# Patient Record
Sex: Female | Born: 2000 | Race: Black or African American | Hispanic: No | Marital: Single | Smoking: Former smoker
Health system: Southern US, Community
[De-identification: ages and names within clinical notes are randomized; demographics above are authoritative.]

## PROBLEM LIST (undated history)

## (undated) DIAGNOSIS — Z789 Other specified health status: Secondary | ICD-10-CM

## (undated) DIAGNOSIS — U071 COVID-19: Secondary | ICD-10-CM

## (undated) HISTORY — PX: NO PAST SURGERIES: SHX2092

---
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
## 2021-01-01 ENCOUNTER — Telehealth: Payer: Self-pay | Admitting: Family Medicine

## 2021-01-01 NOTE — Telephone Encounter (Signed)
Normal lab report discussed.  Continue Pepcid as instructed. Return and ED precautions discussed.  She agreed with the plan.

## 2021-07-25 ENCOUNTER — Encounter: Payer: Self-pay | Admitting: Family Medicine

## 2021-07-25 ENCOUNTER — Encounter (HOSPITAL_COMMUNITY): Payer: Self-pay | Admitting: Obstetrics & Gynecology

## 2021-07-25 ENCOUNTER — Inpatient Hospital Stay (HOSPITAL_COMMUNITY)
Admission: AD | Admit: 2021-07-25 | Discharge: 2021-07-25 | Disposition: A | Discharge status: Home / Self Care | Payer: Medicaid Other | Attending: Obstetrics & Gynecology | Admitting: Obstetrics & Gynecology

## 2021-07-25 ENCOUNTER — Inpatient Hospital Stay (HOSPITAL_COMMUNITY): Payer: Medicaid Other

## 2021-07-25 ENCOUNTER — Ambulatory Visit (INDEPENDENT_AMBULATORY_CARE_PROVIDER_SITE_OTHER): Payer: Medicaid Other | Admitting: Family Medicine

## 2021-07-25 VITALS — BP 122/62 | HR 84 | Ht 67.0 in | Wt 169.2 lb

## 2021-07-25 DIAGNOSIS — O200 Threatened abortion: Secondary | ICD-10-CM

## 2021-07-25 DIAGNOSIS — O26891 Other specified pregnancy related conditions, first trimester: Secondary | ICD-10-CM | POA: Diagnosis not present

## 2021-07-25 DIAGNOSIS — O3680X0 Pregnancy with inconclusive fetal viability, not applicable or unspecified: Secondary | ICD-10-CM | POA: Diagnosis not present

## 2021-07-25 DIAGNOSIS — O209 Hemorrhage in early pregnancy, unspecified: Secondary | ICD-10-CM | POA: Insufficient documentation

## 2021-07-25 DIAGNOSIS — R1030 Lower abdominal pain, unspecified: Secondary | ICD-10-CM | POA: Diagnosis not present

## 2021-07-25 DIAGNOSIS — Z8616 Personal history of COVID-19: Secondary | ICD-10-CM | POA: Diagnosis not present

## 2021-07-25 DIAGNOSIS — Z3200 Encounter for pregnancy test, result unknown: Secondary | ICD-10-CM | POA: Diagnosis not present

## 2021-07-25 DIAGNOSIS — Z3A01 Less than 8 weeks gestation of pregnancy: Secondary | ICD-10-CM | POA: Diagnosis not present

## 2021-07-25 DIAGNOSIS — Z87891 Personal history of nicotine dependence: Secondary | ICD-10-CM | POA: Diagnosis not present

## 2021-07-25 DIAGNOSIS — N939 Abnormal uterine and vaginal bleeding, unspecified: Secondary | ICD-10-CM | POA: Diagnosis not present

## 2021-07-25 HISTORY — DX: Other specified health status: Z78.9

## 2021-07-25 LAB — CBC
HCT: 38.2 % (ref 36.0–46.0)
Hemoglobin: 13.2 g/dL (ref 12.0–15.0)
MCH: 30.1 pg (ref 26.0–34.0)
MCHC: 34.6 g/dL (ref 30.0–36.0)
MCV: 87.2 fL (ref 80.0–100.0)
Platelets: 246 10*3/uL (ref 150–400)
RBC: 4.38 MIL/uL (ref 3.87–5.11)
RDW: 13.2 % (ref 11.5–15.5)
WBC: 6.2 10*3/uL (ref 4.0–10.5)
nRBC: 0 % (ref 0.0–0.2)

## 2021-07-25 LAB — URINALYSIS, ROUTINE W REFLEX MICROSCOPIC
Bilirubin Urine: NEGATIVE
Glucose, UA: NEGATIVE mg/dL
Ketones, ur: NEGATIVE mg/dL
Leukocytes,Ua: NEGATIVE
Nitrite: NEGATIVE
Protein, ur: NEGATIVE mg/dL
Specific Gravity, Urine: 1.023 (ref 1.005–1.030)
pH: 5 (ref 5.0–8.0)

## 2021-07-25 LAB — HCG, QUANTITATIVE, PREGNANCY: hCG, Beta Chain, Quant, S: 536 m[IU]/mL — ABNORMAL HIGH (ref <5)

## 2021-07-25 LAB — WET PREP, GENITAL
Sperm: NONE SEEN
Trich, Wet Prep: NONE SEEN
WBC, Wet Prep HPF POC: <10 (ref <10)
Yeast Wet Prep HPF POC: NONE SEEN

## 2021-07-25 LAB — ABO/RH: ABO/RH(D): O POS

## 2021-07-25 LAB — POCT URINE PREGNANCY: Preg Test, Ur: POSITIVE — AB

## 2021-07-25 NOTE — Patient Instructions (Signed)
Go directly to the MAU (maternity assessment unit)  ? ?Maternity Assessment Unit (MAU)  ?Address: [address] [place], [place], [place] [address] ?Hours:  ?Open 24 hours ?Phone: [phone]  ? ? ? ? ?

## 2021-07-25 NOTE — MAU Note (Signed)
.  Valerie [name] is a 21 y.o. at Unknown here in MAU reporting: pt went to family practice today had positive pregnancy test 2 days ago. Stared having vaginal bleeding two night ago it was heavy then and a blood clot came out.  ?Still having some bleeding today with some cramping ?Onset of complaint: 2 days ?Pain score: 7/10 ?Vitals:  ? 07/25/21 1719  ?BP: 128/66  ?Pulse: 84  ?Resp: 18  ?Temp: 99.2 ?F (37.3 ?C)  ?   ?FHT:n/a ?Lab orders placed from triage:  u/a ? ?

## 2021-07-25 NOTE — Discharge Instructions (Signed)

## 2021-07-25 NOTE — MAU Provider Note (Addendum)
?History  ?  ? ?CSN: [id number] ? ?Arrival date and time: 07/25/21 1618 ? ? Event Date/Time  ? First Provider Initiated Contact with Patient 07/25/21 1744   ?  ? ?Chief Complaint  ?Patient presents with  ? Vaginal Bleeding  ? ?Valerie [name] is a 21 year old G1P0 female who presents with chief complaint of vaginal bleeding and cramping. She was seen at [facility] and was instructed to come to MAU due to her symptoms. She states her bleeding began 2 nights ago and states the bleeding is light and uses 1 pad per day. She states she passed a clot 2 nights ago but hasn't passed any clots since. She endorses lower abdominal cramping in her lower abdomen and is currently having light spotting. She denies any alleviating factors and states her pain is aggravated by touch.  ? ?Vaginal Bleeding ?The patient's primary symptoms include vaginal bleeding. The patient's pertinent negatives include no vaginal discharge. This is a new problem. The current episode started yesterday. The problem occurs constantly. The problem has been unchanged. The pain is moderate. The problem affects both sides. She is pregnant. Associated symptoms include abdominal pain and chills. Pertinent negatives include no constipation, diarrhea, fever, frequency, headaches, nausea or vomiting. The vaginal bleeding is spotting. She has been passing clots. She has not been passing tissue. The symptoms are aggravated by tactile pressure. She has tried nothing for the symptoms. She is sexually active.  ? ?OB History   ? ? Gravida  ?1  ? Para  ?   ? Term  ?   ? Preterm  ?   ? AB  ?   ? Living  ?   ?  ? ? SAB  ?   ? IAB  ?   ? Ectopic  ?   ? Multiple  ?   ? Live Births  ?   ?   ?  ?  ? ? ?Past Medical History:  ?Diagnosis Date  ? COVID-19   ? Medical history non-contributory   ? ? ?Past Surgical History:  ?Procedure Laterality Date  ? NO PAST SURGERIES    ? ? ?Family History  ?Problem Relation Age of Onset  ? Diabetes Mother   ? Healthy Father    ? ? ?Social History  ? ?Tobacco Use  ? Smoking status: Former  ?  Years: 2.00  ?  Types: Cigarettes  ?  Quit date: 07/15/2021  ?  Years since quitting: 0.0  ? Smokeless tobacco: Never  ? Tobacco comments:  ?  Was vaping daily, cigarates, off and on , stopped both when found out preg  ?Vaping Use  ? Vaping Use: Former  ?Substance Use Topics  ? Alcohol use: Not Currently  ?  Comment: had been drinking heavy "had events"  ? Drug use: Yes  ?  Frequency: 7.0 times per week  ?  Types: Marijuana  ? ? ?Allergies: No Known Allergies ? ?Medications Prior to Admission  ?Medication Sig Dispense Refill Last Dose  ? famotidine (PEPCID) 20 MG tablet Take 1 tablet (20 mg total) by mouth daily. 30 tablet 1   ? ibuprofen (ADVIL) 800 MG tablet Take 1 tablet (800 mg total) by mouth 3 (three) times daily. (Patient not taking: Reported on 12/29/2020) 30 tablet 0   ? loratadine (CLARITIN REDITABS) 10 MG dissolvable tablet Take 1 tablet (10 mg total) by mouth daily. As needed for allergy symptoms (Patient not taking: Reported on 01/02/2020) 30 tablet 0   ?  metoCLOPramide (REGLAN) 10 MG tablet Take 1 tablet (10 mg total) by mouth every 8 (eight) hours as needed for nausea or vomiting. (Patient not taking: Reported on 12/29/2020) 10 tablet 0   ? norgestimate-ethinyl estradiol (SPRINTEC 28) 0.25-35 MG-MCG tablet Take 1 tablet by mouth daily. (Patient not taking: No sig reported) 1 Package 11   ? omeprazole (PRILOSEC) 20 MG capsule Take 1 capsule (20 mg total) by mouth daily. (Patient not taking: Reported on 12/29/2020) 30 capsule 0   ? ondansetron (ZOFRAN ODT) 4 MG disintegrating tablet Take 1 tablet (4 mg total) by mouth every 8 (eight) hours as needed for nausea or vomiting. (Patient not taking: Reported on 12/29/2020) 10 tablet 0   ? polyethylene glycol powder (GLYCOLAX/MIRALAX) powder Take 17 g by mouth daily. (Patient not taking: No sig reported) 225 g 1   ? senna-docusate (SENOKOT-S) 8.6-50 MG tablet Take 1 tablet by mouth at bedtime.  (Patient not taking: No sig reported) 30 tablet 2   ? ? ?Review of Systems  ?Constitutional:  Positive for chills. Negative for diaphoresis and fever.  ?Respiratory:  Negative for chest tightness and shortness of breath.   ?Cardiovascular:  Negative for chest pain.  ?Gastrointestinal:  Positive for abdominal pain. Negative for constipation, diarrhea, nausea and vomiting.  ?Genitourinary:  Positive for vaginal bleeding. Negative for frequency and vaginal discharge.  ?Neurological:  Negative for dizziness, light-headedness and headaches.  ?Physical Exam  ? ?Blood pressure 128/66, pulse 84, temperature 99.2 ?F (37.3 ?C), resp. rate 18, height 5\' 7"  (1.702 m), weight 76.7 kg, last menstrual period 06/15/2021. ? ?Physical Exam ?Constitutional:   ?   General: She is not in acute distress. ?   Appearance: Normal appearance. She is not ill-appearing.  ?HENT:  ?   Head: Normocephalic and atraumatic.  ?   Right Ear: External ear normal.  ?   Left Ear: External ear normal.  ?   Nose: Nose normal.  ?Eyes:  ?   General:     ?   Right eye: No discharge.     ?   Left eye: No discharge.  ?Cardiovascular:  ?   Rate and Rhythm: Normal rate and regular rhythm.  ?   Pulses: Normal pulses.  ?   Heart sounds: Normal heart sounds.  ?Pulmonary:  ?   Effort: Pulmonary effort is normal.  ?   Breath sounds: Normal breath sounds.  ?Abdominal:  ?   General: Bowel sounds are normal. There is no distension.  ?   Palpations: Abdomen is soft.  ?   Tenderness: There is no abdominal tenderness. There is no guarding.  ?Musculoskeletal:     ?   General: Normal range of motion.  ?   Cervical back: Normal range of motion.  ?Skin: ?   General: Skin is warm and dry.  ?Neurological:  ?   General: No focal deficit present.  ?   Mental Status: She is alert and oriented to person, place, and time. Mental status is at baseline.  ?Psychiatric:     ?   Mood and Affect: Mood normal.     ?   Behavior: Behavior normal.  ? ? ?MAU Course  ?Procedures ? ?MDM ?CBC, TV  U/S, Abo RH, bHCG, UA, wet prep, and GC/chlamydia ? ?Clayton Lefort, PA-S ?07/25/2021, 5:49 PM ? ?Assessment and Plan  ? ?CNM attestation: ? ?I have seen and examined this patient and agree with above documentation in the PA student's note.  ? ?Valerie [name] is  a 21 y.o. G1P0 at [redacted]w[redacted]d reporting vaginal bleeding x2 nights. Reports she passed 1 clot 2 nights ago, but none since. Has only had to change pad once/day. Reports some mild lower abdominal cramping that she has not taken anything for. She is unsure of her LMP, but thinks it was sometime in early March. She reports she went to [facility] today and was told to come here for ultrasound.  ? ?PE: ?Patient Vitals for the past 24 hrs: ? BP Temp Pulse Resp Height Weight  ?07/25/21 1719 128/66 99.2 ?F (37.3 ?C) 84 18 5\' 7"  (1.702 m) 76.7 kg  ? ?Gen: calm, comfortable, no acute distress ?Resp: normal effort, no distress ?Heart: regular rate ?Abd: soft, non-tender ? ?ROS, labs, PMH reviewed ? ?Orders Placed This Encounter  ?Procedures  ? Wet prep, genital  ? US OB LESS THAN 14 WEEKS WITH OB TRANSVAGINAL  ? Urinalysis, Routine w reflex microscopic Urine, Clean Catch  ? CBC  ? hCG, quantitative, pregnancy  ? ABO/Rh  ? Discharge patient  ? ?No orders of the defined types were placed in this encounter. ? ?US OB LESS THAN 14 WEEKS WITH OB TRANSVAGINAL ? ?Result Date: 07/25/2021 ?CLINICAL DATA:  21 year old pregnant female presents with 2 days of vaginal bleeding. EDC by LMP: 03/22/2022, projecting to an expected gestational age of [redacted] weeks 5 days. Quantitative beta HCG not available at the time of this dictation. EXAM: OBSTETRIC <14 WK [place] AND TRANSVAGINAL OB US TECHNIQUE: Both transabdominal and transvaginal ultrasound examinations were performed for complete evaluation of the gestation as well as the maternal uterus, adnexal regions, and pelvic cul-de-sac. Transvaginal technique was performed to assess early pregnancy. COMPARISON:  None. FINDINGS:  Intrauterine gestational sac: None Maternal uterus/adnexae: Anteverted uterus. No uterine fibroids. Bilayer endometrial thickness 11 mm. Mildly heterogeneous endometrium. No focal endometrial mass. Right ovary measures 2.2 x 1.5

## 2021-07-25 NOTE — Progress Notes (Signed)
   SUBJECTIVE:   CHIEF COMPLAINT / HPI:    Valerie [name] is a 21 y.o. female here for possible miscarriage. She is having vaginal bleeding and has been seeing clots when she goes to the bathroom.  She took 3 pregnancy test about 2 weeks ago.  She did not believe the results as she thought her tests were cheap and were fake. She was stressed bout being pregnant. There is a lot going on in her family. Her boyfriend was surprised but chill about everything when she told him she was pregnant.  Last LMP was in early March (she is unsure of the date).  States it was a regular period in length and flow.    PERTINENT  PMH / PSH: reviewed and updated as appropriate   OBJECTIVE:   BP 122/62   Pulse 84   Ht 5\' 7"  (1.702 m)   Wt 169 lb 3.2 oz (76.7 kg)   SpO2 99%   BMI 26.50 kg/m    GEN: well appearing female in no acute distress  CVS: well perfused  RESP: speaking in full sentences without pause, no respiratory distress    ASSESSMENT/PLAN:   Pregnant less than 8 weeks  Vaginal bleeding with concern for miscarriage  Urine pregnancy test positive.  Patient advised to go to the MAU for further evaluation and ultrasound for threatened pregnancy.  LMP unsure but seems she would be less than 6 weeks.  MAU directions and map provided. Pt to held over now.   , DO PGY-3, [facility] 07/25/2021

## 2021-07-26 ENCOUNTER — Telehealth (HOSPITAL_COMMUNITY): Payer: Self-pay

## 2021-07-26 LAB — GC/CHLAMYDIA PROBE AMP (~~LOC~~) NOT AT ARMC
Chlamydia: POSITIVE — AB
Comment: NEGATIVE
Comment: NORMAL
Neisseria Gonorrhea: NEGATIVE

## 2021-07-26 MED ORDER — AZITHROMYCIN 500 MG PO TABS: 1000.0000 mg | ORAL_TABLET | Freq: Once | ORAL | 0 refills | Status: AC

## 2021-07-27 ENCOUNTER — Other Ambulatory Visit (HOSPITAL_COMMUNITY): Payer: Medicaid Other

## 2021-07-27 ENCOUNTER — Inpatient Hospital Stay (HOSPITAL_COMMUNITY)
Admission: AD | Admit: 2021-07-27 | Discharge: 2021-07-27 | Disposition: A | Discharge status: Home / Self Care | Payer: Medicaid Other | Attending: Obstetrics and Gynecology | Admitting: Obstetrics and Gynecology

## 2021-07-27 DIAGNOSIS — O039 Complete or unspecified spontaneous abortion without complication: Secondary | ICD-10-CM | POA: Diagnosis not present

## 2021-07-27 LAB — HCG, QUANTITATIVE, PREGNANCY: hCG, Beta Chain, Quant, S: 198 m[IU]/mL — ABNORMAL HIGH (ref <5)

## 2021-07-27 MED ORDER — IBUPROFEN 600 MG PO TABS: 600.0000 mg | ORAL_TABLET | Freq: Four times a day (QID) | ORAL | 3 refills | Status: AC | PRN

## 2021-07-27 NOTE — MAU Provider Note (Signed)
S ?Valerie [name] is a 21 y.o. G1P0 pregnant female who presents to MAU today for repeat HCG after being seen on 07/25/21 for vaginal bleeding in the presence of a positive pregnancy test. She was diagnosed with a pregnancy of unknown location, given ectopic precautions and requested to come back today for repeat bloodwork. Denies abdominal pain right now but is having some increased bleeding, now having to change her pad twice daily. Is not soaking pads. Has some questions about why she is coming back today and if she will have another ultrasound today. No other physical complaints. Came here straight from work in [place] and requests to be able to leave while we wait for results. ? ?Pertinent items noted in HPI and remainder of comprehensive ROS otherwise negative.  ? ?O ?BP 123/77   Pulse 68   Temp (!) 97.3 ?F (36.3 ?C)   Resp 17   Ht 5\' 7"  (1.702 m)   Wt 170 lb (77.1 kg)   LMP 06/15/2021 (Within Days)   SpO2 100%   BMI 26.63 kg/m?  ?Physical Exam ?Vitals and nursing note reviewed.  ?Constitutional:   ?   Appearance: Normal appearance.  ?HENT:  ?   Head: Normocephalic and atraumatic.  ?   Mouth/Throat:  ?   Mouth: Mucous membranes are moist.  ?Eyes:  ?   Pupils: Pupils are equal, round, and reactive to light.  ?Cardiovascular:  ?   Rate and Rhythm: Normal rate and regular rhythm.  ?Pulmonary:  ?   Effort: Pulmonary effort is normal.  ?Abdominal:  ?   Palpations: Abdomen is soft.  ?   Tenderness: There is no abdominal tenderness.  ?Musculoskeletal:     ?   General: Normal range of motion.  ?   Cervical back: Normal range of motion.  ?Skin: ?   General: Skin is warm and dry.  ?   Capillary Refill: Capillary refill takes less than 2 seconds.  ?Neurological:  ?   Mental Status: She is alert and oriented to person, place, and time.  ?Psychiatric:     ?   Mood and Affect: Mood normal.     ?   Behavior: Behavior normal.     ?   Thought Content: Thought content normal.     ?   Judgment: Judgment  normal.  ? ?Results for orders placed or performed during the hospital encounter of 07/27/21 (from the past 24 hour(s))  ?hCG, quantitative, pregnancy     Status: Abnormal  ? Collection Time: 07/27/21  9:24 PM  ?Result Value Ref Range  ? hCG, Beta Chain, Quant, S 198 (H) <5 mIU/mL  ? ?MDM/MAU Course ?HCG on 4/12= 536, test today confirms miscarriage. Called [name] to give her results and condolences. Had reviewed the options for a normal rise, abnormal rise or fall including cytotec therapy or expectant management. She chose expectant management with follow up in the office (specifically with me). Sent a message to [facility] for scheduling assistance and sent ibuprofen to the pharmacy for pain management. Reviewed bleeding precautions. ? ?A ?Miscarriage ?Medical screening exam complete ? ?P ?Discharge from MAU in stable condition with bleeding precautions ?Follow up at [facility] in one week for repeat HCG and two weeks with me for SAB follow up/birth control counseling if desired. ? ?[facility], CNM ?07/27/2021 11:27 PM  ? ?

## 2021-07-27 NOTE — MAU Note (Addendum)
.  Valerie [name] is a 21 y.o. at [redacted]w[redacted]d here in MAU for repeat BHCG. Continues to have some vag bleeding using 2 pads a day. No pain now. BCHG drawn in Triage. Gaylan Gerold CNM in to see pt and discuss plan of care. Pt requested to leave and be called with results and Gaylan Gerold CNM oked this. Pt's questions answered and she was d/c home from Family Rm ?LMP: [redacted]w[redacted]d ?Onset of complaint: follow up BHCG ?Pain score: 0 ?Vitals:  ? 07/27/21 2126 07/27/21 2127  ?BP:  123/77  ?Pulse: 68   ?Resp: 17   ?Temp: (!) 97.3 ?F (36.3 ?C)   ?SpO2: 100%   ?   ?FHT:n/a ?Lab orders placed from triage: none  ? ?

## 2021-08-02 ENCOUNTER — Other Ambulatory Visit: Payer: Self-pay

## 2021-08-02 DIAGNOSIS — O039 Complete or unspecified spontaneous abortion without complication: Secondary | ICD-10-CM

## 2021-08-03 ENCOUNTER — Other Ambulatory Visit: Payer: Medicaid Other

## 2021-08-06 ENCOUNTER — Other Ambulatory Visit: Payer: Medicaid Other

## 2021-08-15 ENCOUNTER — Ambulatory Visit: Payer: Medicaid Other | Admitting: Certified Nurse Midwife

## 2021-08-15 DIAGNOSIS — O039 Complete or unspecified spontaneous abortion without complication: Secondary | ICD-10-CM

## 2021-08-17 NOTE — Progress Notes (Signed)
No show

## 2021-08-29 ENCOUNTER — Ambulatory Visit: Payer: Medicaid Other | Admitting: Family Medicine

## 2021-08-29 NOTE — Progress Notes (Deleted)
    SUBJECTIVE:   CHIEF COMPLAINT / HPI:  No chief complaint on file.   ***  PERTINENT  PMH / PSH: ***  Patient Care Team: Zola Button, MD as PCP - General (Family Medicine)   OBJECTIVE:   LMP 06/15/2021 (Within Days)   Physical Exam      07/25/2021    3:07 PM  Depression screen PHQ 2/9  Decreased Interest 1  Down, Depressed, Hopeless 1  PHQ - 2 Score 2  Altered sleeping 0  Tired, decreased energy 1  Change in appetite 1  Feeling bad or failure about yourself  0  Trouble concentrating 0  Moving slowly or fidgety/restless 0  Suicidal thoughts 0  PHQ-9 Score 4  Difficult doing work/chores Not difficult at all     {Show previous vital signs (optional):23777}  {Labs  Heme  Chem  Endocrine  Serology  Results Review (optional):23779}  ASSESSMENT/PLAN:   No problem-specific Assessment & Plan notes found for this encounter.    No follow-ups on file.   Zola Button, MD [place]

## 2021-10-24 ENCOUNTER — Ambulatory Visit (HOSPITAL_COMMUNITY): Payer: Medicaid Other

## 2021-10-25 ENCOUNTER — Ambulatory Visit (HOSPITAL_COMMUNITY)
Admission: EM | Admit: 2021-10-25 | Discharge: 2021-10-25 | Disposition: A | Discharge status: Home / Self Care | Payer: Medicaid Other | Attending: Internal Medicine | Admitting: Internal Medicine

## 2021-10-25 ENCOUNTER — Other Ambulatory Visit: Payer: Self-pay

## 2021-10-25 ENCOUNTER — Encounter (HOSPITAL_COMMUNITY): Payer: Self-pay | Admitting: Emergency Medicine

## 2021-10-25 DIAGNOSIS — Z3202 Encounter for pregnancy test, result negative: Secondary | ICD-10-CM

## 2021-10-25 DIAGNOSIS — J069 Acute upper respiratory infection, unspecified: Secondary | ICD-10-CM | POA: Diagnosis not present

## 2021-10-25 LAB — POC URINE PREG, ED: Preg Test, Ur: NEGATIVE

## 2021-10-25 MED ORDER — PROMETHAZINE-DM 6.25-15 MG/5ML PO SYRP: 5.0000 mL | ORAL_SOLUTION | Freq: Four times a day (QID) | ORAL | 0 refills | Status: DC | PRN

## 2021-10-25 NOTE — Discharge Instructions (Addendum)
These take medications as prescribed Please maintain adequate hydration No indication for COVID testing at this time Return to urgent care if symptoms worsen.

## 2021-10-25 NOTE — ED Notes (Signed)
Urine in lab 

## 2021-10-25 NOTE — ED Provider Notes (Signed)
[facility]    CSN: [id number] Arrival date & time: 10/25/21  1805      History   Chief Complaint Chief Complaint  Patient presents with   Cough    Entered by patient    HPI Valerie [name] is a 21 y.o. female presents to the urgent care with 5-day history of cough, nasal congestion, sore throat.  Patient had subjective fever and chills at the outset of the illness.  Fever and chills have subsided.  Patient initially had a cough productive of greenish sputum which has subsided as well.  No nausea, vomiting or diarrhea.  No chest pain or chest tightness.  No dizziness, near syncope or syncopal episodes.  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  She is not vaccinated against COVID-19 virus.  She has contracted COVID-19 twice over the past few years.  Patient wants to get tested for pregnancy.  Last menstrual period was in the middle of last month. HPI  Past Medical History:  Diagnosis Date   COVID-19    Medical history non-contributory     Patient Active Problem List   Diagnosis Date Noted   Vaginal candidiasis 02/12/2019   Allergic 01/15/2019   Contraception 06/12/2016   Abdominal cramping 05/03/2016    Past Surgical History:  Procedure Laterality Date   NO PAST SURGERIES      OB History     Gravida  1   Para      Term      Preterm      AB      Living         SAB      IAB      Ectopic      Multiple      Live Births               Home Medications    Prior to Admission medications   Medication Sig Start Date End Date Taking? Authorizing Provider  promethazine-dextromethorphan (PROMETHAZINE-DM) 6.25-15 MG/5ML syrup Take 5 mLs by mouth 4 (four) times daily as needed for cough. 10/25/21  Yes Chancy Claros, Britta Mccreedy, MD  ibuprofen (ADVIL) 600 MG tablet Take 1 tablet (600 mg total) by mouth every 6 (six) hours as needed. 07/27/21   Bernerd Limbo, CNM    Family History Family History  Problem Relation  Age of Onset   Diabetes Mother    Healthy Father     Social History Social History   Tobacco Use   Smoking status: Former    Years: 2.00    Types: Cigarettes    Quit date: 07/15/2021    Years since quitting: 0.2   Smokeless tobacco: Never   Tobacco comments:    Was vaping daily, cigarates, off and on , stopped both when found out preg  Vaping Use   Vaping Use: Former  Substance Use Topics   Alcohol use: Not Currently    Comment: had been drinking heavy "had events"   Drug use: Yes    Frequency: 7.0 times per week    Types: Marijuana     Allergies   Patient has no known allergies.   Review of Systems Review of Systems As per HPI  Physical Exam Triage Vital Signs ED Triage Vitals  Enc Vitals Group     BP 10/25/21 1929 115/80     Pulse Rate 10/25/21 1929 78     Resp 10/25/21 1929 20     Temp 10/25/21 1929 99.1  F (37.3 C)     Temp Source 10/25/21 1929 Oral     SpO2 10/25/21 1929 97 %     Weight --      Height --      Head Circumference --      Peak Flow --      Pain Score 10/25/21 1924 7     Pain Loc --      Pain Edu? --      Excl. in GC? --    No data found.  Updated Vital Signs BP 115/80 (BP Location: Right Arm)   Pulse 78   Temp 99.1 F (37.3 C) (Oral)   Resp 20   LMP 06/15/2021 (Within Days)   SpO2 97%   Breastfeeding Unknown   Visual Acuity Right Eye Distance:   Left Eye Distance:   Bilateral Distance:    Right Eye Near:   Left Eye Near:    Bilateral Near:     Physical Exam Vitals and nursing note reviewed.  HENT:     Mouth/Throat:     Mouth: Mucous membranes are moist.     Pharynx: Posterior oropharyngeal erythema present.  Cardiovascular:     Rate and Rhythm: Normal rate and regular rhythm.     Pulses: Normal pulses.     Heart sounds: Normal heart sounds.  Pulmonary:     Effort: Pulmonary effort is normal.     Breath sounds: Normal breath sounds.  Abdominal:     General: Bowel sounds are normal.     Palpations: Abdomen is  soft.      UC Treatments / Results  Labs (all labs ordered are listed, but only abnormal results are displayed) Labs Reviewed  POC URINE PREG, ED    EKG   Radiology No results found.  Procedures Procedures (including critical care time)  Medications Ordered in UC Medications - No data to display  Initial Impression / Assessment and Plan / UC Course  I have reviewed the triage vital signs and the nursing notes.  Pertinent labs & imaging results that were available during my care of the patient were reviewed by me and considered in my medical decision making (see chart for details).     1.  Viral URI with cough: Increase oral fluid intake Promethazine dextromethorphan as needed for cough Tylenol/Motrin as needed for pain Urine pregnancy test is negative.-Pregnancy test requested by patient. Patient's lung exam sounds clear Return precautions given. Final Clinical Impressions(s) / UC Diagnoses   Final diagnoses:  Viral URI with cough     Discharge Instructions      These take medications as prescribed Please maintain adequate hydration No indication for COVID testing at this time Return to urgent care if symptoms worsen.   ED Prescriptions     Medication Sig Dispense Auth. Provider   promethazine-dextromethorphan (PROMETHAZINE-DM) 6.25-15 MG/5ML syrup Take 5 mLs by mouth 4 (four) times daily as needed for cough. 118 mL Dezman Granda, Britta Mccreedy, MD      PDMP not reviewed this encounter.   Merrilee Jansky, MD 10/25/21 2000

## 2021-10-25 NOTE — ED Triage Notes (Addendum)
Complains of a cough since Saturday.  reports phlegm is clear and then sometimes green.  Patient has a stuffy nose.    Requesting pregnancy test

## 2021-10-26 ENCOUNTER — Encounter (HOSPITAL_COMMUNITY): Payer: Self-pay

## 2021-10-26 ENCOUNTER — Ambulatory Visit (HOSPITAL_COMMUNITY)
Admission: RE | Admit: 2021-10-26 | Discharge: 2021-10-26 | Disposition: A | Discharge status: Home / Self Care | Payer: Medicaid Other | Source: Ambulatory Visit | Attending: Emergency Medicine | Admitting: Emergency Medicine

## 2021-10-26 VITALS — BP 125/84 | HR 85 | Temp 98.4°F | Resp 18

## 2021-10-26 DIAGNOSIS — R051 Acute cough: Secondary | ICD-10-CM | POA: Diagnosis not present

## 2021-10-26 DIAGNOSIS — Z20822 Contact with and (suspected) exposure to covid-19: Secondary | ICD-10-CM | POA: Insufficient documentation

## 2021-10-26 NOTE — ED Triage Notes (Signed)
Onset x 3-4 days of cough and congestion. Pt was seen yesterday and requested covid testing but it was not completed do to time when symptoms first started. 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

## 2021-10-26 NOTE — Discharge Instructions (Addendum)
You will receive the results of your covid test within 6-24 hours. We will call you with a positive result.  Try mucinex twice daily for congestion. You can try benadryl at night time too. Ibuprofen every 6 hours for any pain or fever.  Try the cough medicine as prescribed if needed.  Increase your fluid intake.  Please go to the emergency department if symptoms worsen.

## 2021-10-26 NOTE — ED Provider Notes (Signed)
[facility]    CSN: [id number] Arrival date & time: 10/26/21  1548     History   Chief Complaint Chief Complaint  Patient presents with   Cough    Entered by patient    HPI Valerie [name] is a 21 y.o. female.  Presents with 5-day history of cough and congestion.  She was seen yesterday at this urgent care and diagnosed with viral URI.  Given cough medicine prescription but has not picked up yet.  She was not tested for COVID and returns today requesting COVID testing. She has been drinking lots of fluids.  Also requesting return to work note.  Past Medical History:  Diagnosis Date   COVID-19    Medical history non-contributory     Patient Active Problem List   Diagnosis Date Noted   Vaginal candidiasis 02/12/2019   Allergic 01/15/2019   Contraception 06/12/2016   Abdominal cramping 05/03/2016    Past Surgical History:  Procedure Laterality Date   NO PAST SURGERIES      OB History     Gravida  1   Para      Term      Preterm      AB      Living         SAB      IAB      Ectopic      Multiple      Live Births               Home Medications    Prior to Admission medications   Medication Sig Start Date End Date Taking? Authorizing Provider  ibuprofen (ADVIL) 600 MG tablet Take 1 tablet (600 mg total) by mouth every 6 (six) hours as needed. 07/27/21   Bernerd Limbo, CNM  promethazine-dextromethorphan (PROMETHAZINE-DM) 6.25-15 MG/5ML syrup Take 5 mLs by mouth 4 (four) times daily as needed for cough. 10/25/21   Lamptey, Britta Mccreedy, MD    Family History Family History  Problem Relation Age of Onset   Diabetes Mother    Healthy Father     Social History Social History   Tobacco Use   Smoking status: Former    Years: 2.00    Types: Cigarettes    Quit date: 07/15/2021    Years since quitting: 0.2   Smokeless tobacco: Never   Tobacco comments:    Was vaping daily, cigarates, off and on , stopped both when found out  preg  Vaping Use   Vaping Use: Former  Substance Use Topics   Alcohol use: Not Currently    Comment: had been drinking heavy "had events"   Drug use: Yes    Frequency: 7.0 times per week    Types: Marijuana     Allergies   Patient has no known allergies.   Review of Systems Review of Systems  Respiratory:  Positive for cough.    Per HPI  Physical Exam Triage Vital Signs ED Triage Vitals [10/26/21 1559]  Enc Vitals Group     BP 125/84     Pulse Rate 85     Resp 18     Temp 98.4 F (36.9 C)     Temp Source Oral     SpO2 100 %     Weight      Height      Head Circumference      Peak Flow      Pain Score      Pain Loc  Pain Edu?      Excl. in [place]?    No data found.  Updated Vital Signs BP 125/84 (BP Location: Left Arm)   Pulse 85   Temp 98.4 F (36.9 C) (Oral)   Resp 18   LMP 06/15/2021 (Within Days)   SpO2 100%    Physical Exam Vitals and nursing note reviewed.  Constitutional:      General: She is not in acute distress.    Appearance: Normal appearance.  HENT:     Right Ear: Tympanic membrane and ear canal normal.     Left Ear: Tympanic membrane and ear canal normal.     Nose: Congestion present.     Mouth/Throat:     Mouth: Mucous membranes are moist.     Pharynx: Uvula midline. No posterior oropharyngeal erythema.     Tonsils: No tonsillar exudate or tonsillar abscesses.  Eyes:     Conjunctiva/sclera: Conjunctivae normal.  Cardiovascular:     Rate and Rhythm: Normal rate and regular rhythm.     Pulses: Normal pulses.     Heart sounds: Normal heart sounds.  Pulmonary:     Effort: Pulmonary effort is normal. No respiratory distress.     Breath sounds: Normal breath sounds.  Lymphadenopathy:     Cervical: No cervical adenopathy.  Skin:    General: Skin is warm and dry.  Neurological:     Mental Status: She is alert and oriented to person, place, and time.     UC Treatments / Results  Labs (all labs ordered are listed, but only  abnormal results are displayed) Labs Reviewed  SARS CORONAVIRUS 2 (TAT 6-24 HRS)    EKG  Radiology No results found.  Procedures Procedures (including critical care time)  Medications Ordered in UC Medications - No data to display  Initial Impression / Assessment and Plan / UC Course  I have reviewed the triage vital signs and the nursing notes.  Pertinent labs & imaging results that were available during my care of the patient were reviewed by me and considered in my medical decision making (see chart for details).  COVID test pending. Continue symptomatic care at home. Recommend mucinex/benadryl. Continue drinking lots of fluids. Work note provided. Set up with MyChart. Return precautions discussed. Patient agrees to plan and is discharged in stable condition.  Final Clinical Impressions(s) / UC Diagnoses   Final diagnoses:  Acute cough  Encounter for screening laboratory testing for COVID-19 virus     Discharge Instructions      You will receive the results of your covid test within 6-24 hours. We will call you with a positive result.  Try mucinex twice daily for congestion. You can try benadryl at night time too. Ibuprofen every 6 hours for any pain or fever.  Try the cough medicine as prescribed if needed.  Increase your fluid intake.  Please go to the emergency department if symptoms worsen.     ED Prescriptions   None    PDMP not reviewed this encounter.   Azeez Dunker, [place] 10/26/21 1631

## 2021-10-27 LAB — SARS CORONAVIRUS 2 (TAT 6-24 HRS): SARS Coronavirus 2: NEGATIVE

## 2022-02-01 ENCOUNTER — Ambulatory Visit (INDEPENDENT_AMBULATORY_CARE_PROVIDER_SITE_OTHER): Payer: Medicaid Other | Admitting: Family Medicine

## 2022-02-01 ENCOUNTER — Other Ambulatory Visit (HOSPITAL_COMMUNITY)
Admission: RE | Admit: 2022-02-01 | Discharge: 2022-02-01 | Disposition: A | Discharge status: Home / Self Care | Payer: Medicaid Other | Source: Ambulatory Visit | Attending: Family Medicine | Admitting: Family Medicine

## 2022-02-01 ENCOUNTER — Encounter: Payer: Self-pay | Admitting: Family Medicine

## 2022-02-01 VITALS — BP 114/81 | HR 76 | Ht 67.0 in | Wt 176.6 lb

## 2022-02-01 DIAGNOSIS — N898 Other specified noninflammatory disorders of vagina: Secondary | ICD-10-CM | POA: Insufficient documentation

## 2022-02-01 DIAGNOSIS — R0789 Other chest pain: Secondary | ICD-10-CM

## 2022-02-01 DIAGNOSIS — Z30011 Encounter for initial prescription of contraceptive pills: Secondary | ICD-10-CM | POA: Insufficient documentation

## 2022-02-01 LAB — POCT WET PREP (WET MOUNT)
Clue Cells Wet Prep Whiff POC: POSITIVE
Trichomonas Wet Prep HPF POC: ABSENT
WBC, Wet Prep HPF POC: NONE SEEN

## 2022-02-01 LAB — POCT URINE PREGNANCY: Preg Test, Ur: NEGATIVE

## 2022-02-01 MED ORDER — NORGESTIM-ETH ESTRAD TRIPHASIC 0.18/0.215/0.25 MG-25 MCG PO TABS: 1.0000 | ORAL_TABLET | Freq: Every day | ORAL | 11 refills | Status: DC

## 2022-02-01 NOTE — Patient Instructions (Addendum)
It was wonderful to see you today.  Please bring ALL of your medications with you to every visit.   Updates from today's visit:  We tested you for STIs today. We are also checking a pregnancy test. I will call you if the results are abnormal. If they are normal, you will receive a letter in the mail. You can try taking NSAIDs (ibuprofen, Aleve) for your chest pains I have prescribed your birth control pill. Please take this as prescribed. I have included information about oral contraceptive pills.   Please follow up as needed  Thank you for choosing [facility].   Please call [phone] with any questions about today's appointment.  Please be sure to schedule follow up at the front  desk before you leave today.   August Albino, MD  Family Medicine

## 2022-02-01 NOTE — Assessment & Plan Note (Signed)
Patient without contraindications to OCPs.  Discussed risks and benefits, patient expresses understanding.  Patient has used OCPs in the past and understands how to take them. -Prescribed combined OCP -Provided information and counseling on OCPs

## 2022-02-01 NOTE — Progress Notes (Signed)
    SUBJECTIVE:   CHIEF COMPLAINT / HPI:   Vaginal discharge -Clear discharge x1 week -No odor, pain, or itching  LMP around 10/6-10/12, no bleeding or spotting in between periods, periods are regular and typically occur first week of month and last 5-7 days without significantly heavy bleeding or pain  Patient wishes to be checked for STIs since she has tested positive for chlamydia recently and completed her treatment course - Types of sex: Oral and vaginal, uses condoms most of the time - Partner genders: Female - Number of partners in last year: 1 - Prior STI history: Hx chlamydia 83mo ago, finished abx for this - Birth control: Has tried depo shot and OCPs, most recently over 2 years ago. Wants to take OCPs again -Agreeable with preg test today   Birth control -Smokes marijuana but not cigarettes -Used to use tobacco wraps -Occasional headaches when dehydrated, no regular migraines -Interested in [place], has used before   Chest pains Patient reporting occasional positional chest pains -midline, sudden and sharp pain x2 weeks -Had a cough prior to onset -Has improved since onset -No radiation to back or arms -Associated with stretching, certain movements of torso -Not associated with exercise or exertion -No palpitations or SOB -Denies personal or family hx of heart disease    OBJECTIVE:   BP 114/81   Pulse 76   Ht 5\' 7"  (1.702 m)   Wt 176 lb 9.6 oz (80.1 kg)   LMP 01/18/2022 (Within Days)   SpO2 100%   BMI 27.66 kg/m    Gen: NAD, alert, responsive, conversational CV: RRR no MRG Pulm: CTAB normal WOB on RA Abd: Soft, moderate discomfort to deep palpation of suprapubic region   ASSESSMENT/PLAN:   Vaginal discharge Speculum exam completed today with female chaperone and under the guidance of attending Dr. Gwendlyn Deutscher.  No irritation or abnormalities noted to the vaginal canal.  Minimal white-colored discharge noted around the cervical os, easily wiped away with  swab.  No gross abnormalities noted of the cervix.  Patient tolerated the exam well.  DDx may include BV, gonorrhea/chlamydia, trichomonas, candidiasis. -Wet prep, G/C swabs -Urine pregnancy negative  Encounter for initial prescription of contraceptive pills Patient without contraindications to OCPs.  Discussed risks and benefits, patient expresses understanding.  Patient has used OCPs in the past and understands how to take them. -Prescribed combined OCP -Provided information and counseling on OCPs  Chest pain Do not feel her pain is cardiac in nature.  Very low suspicion for ACS given her age, lack of history, and nature of her pain.  Appears to be primarily positional and may also be pleuritis/costochondritis related to her recent viral illness. -Advised NSAID use for possible inflammation around the area, encouraged proper stretching -Counseled on red flag symptoms of cardiac chest pain      August Albino, [place]

## 2022-02-01 NOTE — Assessment & Plan Note (Addendum)
Speculum exam completed today with female chaperone and under the guidance of attending Dr. Gwendlyn Deutscher.  No irritation or abnormalities noted to the vaginal canal.  Minimal white-colored discharge noted around the cervical os, easily wiped away with swab.  No gross abnormalities noted of the cervix.  Patient tolerated the exam well.  DDx may include BV, gonorrhea/chlamydia, trichomonas, candidiasis. -Wet prep, G/C swabs -Urine pregnancy negative

## 2022-02-01 NOTE — Assessment & Plan Note (Signed)
Do not feel her pain is cardiac in nature.  Very low suspicion for ACS given her age, lack of history, and nature of her pain.  Appears to be primarily positional and may also be pleuritis/costochondritis related to her recent viral illness. -Advised NSAID use for possible inflammation around the area, encouraged proper stretching -Counseled on red flag symptoms of cardiac chest pain

## 2022-02-04 LAB — CERVICOVAGINAL ANCILLARY ONLY
Chlamydia: POSITIVE — AB
Comment: NEGATIVE
Comment: NORMAL
Neisseria Gonorrhea: NEGATIVE

## 2022-02-06 ENCOUNTER — Encounter: Payer: Self-pay | Admitting: Family Medicine

## 2022-02-06 ENCOUNTER — Telehealth: Payer: Self-pay | Admitting: Family Medicine

## 2022-02-06 DIAGNOSIS — B9689 Other specified bacterial agents as the cause of diseases classified elsewhere: Secondary | ICD-10-CM

## 2022-02-06 DIAGNOSIS — A749 Chlamydial infection, unspecified: Secondary | ICD-10-CM

## 2022-02-06 MED ORDER — METRONIDAZOLE 500 MG PO TABS
500.0000 mg | ORAL_TABLET | Freq: Two times a day (BID) | ORAL | 0 refills | Status: DC
Start: 2022-02-06 — End: 2022-03-04

## 2022-02-06 MED ORDER — DOXYCYCLINE HYCLATE 100 MG PO TABS: 100.0000 mg | ORAL_TABLET | Freq: Two times a day (BID) | ORAL | 0 refills | Status: AC

## 2022-02-06 NOTE — Telephone Encounter (Signed)
Attempted to call patient today and yesterday x5.  No response, unable to leave voicemail. Will send letter to home address.  Positive for chlamydia and BV, will send in both doxycycline and metronidazole to patient's home pharmacy ([facility]).  Discussed with patient during our visit.   If she calls back please inform her of positive results, treating BV with metronidazole BID x7 days; treating chlamydia with doxycycline BID x7 days. Patient should avoid alcohol use while taking metronidazole and should be aware that doxycycline can cause increased sensitivity to sunlight.  August Albino, MD PGY1 [facility]

## 2022-02-11 ENCOUNTER — Telehealth: Payer: Self-pay

## 2022-02-11 NOTE — Telephone Encounter (Signed)
STI sheet was faxed to [facility] on 02/08/22. Salvatore Marvel, CMA

## 2022-02-11 NOTE — Telephone Encounter (Signed)
-----   Message from [place] Pink, [place] sent at 02/05/2022  9:47 AM EDT ----- Regarding: STI reporting [facility], unsure which team this goes to

## 2022-02-11 NOTE — Telephone Encounter (Signed)
Patient returns call to nurse line. Advised of message per Dr. Joeseph Amor.   Talbot Grumbling, RN

## 2022-02-26 ENCOUNTER — Ambulatory Visit: Payer: Medicaid Other | Admitting: Family Medicine

## 2022-02-28 ENCOUNTER — Ambulatory Visit: Payer: Medicaid Other | Admitting: Family Medicine

## 2022-03-04 ENCOUNTER — Ambulatory Visit (INDEPENDENT_AMBULATORY_CARE_PROVIDER_SITE_OTHER): Payer: Medicaid Other | Admitting: Student

## 2022-03-04 ENCOUNTER — Ambulatory Visit (HOSPITAL_COMMUNITY): Payer: Medicaid Other

## 2022-03-04 VITALS — BP 108/70 | HR 95 | Ht 67.0 in | Wt 177.4 lb

## 2022-03-04 DIAGNOSIS — B9689 Other specified bacterial agents as the cause of diseases classified elsewhere: Secondary | ICD-10-CM | POA: Diagnosis not present

## 2022-03-04 DIAGNOSIS — T7840XA Allergy, unspecified, initial encounter: Secondary | ICD-10-CM

## 2022-03-04 DIAGNOSIS — A749 Chlamydial infection, unspecified: Secondary | ICD-10-CM

## 2022-03-04 DIAGNOSIS — N760 Acute vaginitis: Secondary | ICD-10-CM | POA: Insufficient documentation

## 2022-03-04 MED ORDER — CLINDAMYCIN PHOSPHATE (1 DOSE) 2 % VA CREA: TOPICAL_CREAM | VAGINAL | 7 refills | Status: DC

## 2022-03-04 MED ORDER — EPINEPHRINE 0.3 MG/0.3ML IJ SOAJ: 0.3000 mg | INTRAMUSCULAR | 1 refills | Status: AC | PRN

## 2022-03-04 MED ORDER — AZITHROMYCIN 500 MG PO TABS
1000.0000 mg | ORAL_TABLET | Freq: Once | ORAL | Status: AC
  Administered 2022-03-04: 1000 mg via ORAL

## 2022-03-04 NOTE — Assessment & Plan Note (Signed)
No signs of anaphylaxis or respiratory compromise on examination.  The allergic event happened 5 days ago.  We will continue with EpiPen, prescription sent to pharmacy.  We will also continue with referral to allergist for allergic testing.

## 2022-03-04 NOTE — Progress Notes (Signed)
SUBJECTIVE:   CHIEF COMPLAINT / HPI:   Patient seen for complaint of allergic reaction to medication Recently tested positive for chlamydia and BV, was receiving treatment via doxy and metronidazole (started this 5 days ago), took one pill of both of the medications. She ate the morning that she took the medication. She was feeling nauseous and vomited once after taking the medication. She had a facial rash come up 20-30 minutes after taking both medications. Unsure of the cause of the onset of symptoms.  Reports today, she has had no symptoms and all symptoms seem to resolve the day after taking the medications. Swelling in the throat or lips: No  No known history of anaphylaxis She is unsure which medication caused the onset of symptoms as she took them both together. She has not taken any more of either of the medications since the side effects.     02/01/2022    3:31 PM 07/25/2021    3:07 PM 12/29/2020    2:34 PM  PHQ9 SCORE ONLY  PHQ-9 Total Score 10 4 2     PERTINENT  PMH / PSH:   Allergies--NKDA  OBJECTIVE:  BP 108/70   Pulse 95   Ht 5\' 7"  (1.702 m)   Wt 177 lb 6.4 oz (80.5 kg)   LMP 02/18/2022 (Within Days)   SpO2 99%   BMI 27.78 kg/m  Physical Exam Vitals reviewed.  Constitutional:      Appearance: Normal appearance.  HENT:     Head: Normocephalic.     Nose: Nose normal.     Mouth/Throat:     Mouth: Mucous membranes are moist.  Eyes:     Conjunctiva/sclera: Conjunctivae normal.  Cardiovascular:     Rate and Rhythm: Normal rate and regular rhythm.  Pulmonary:     Effort: Pulmonary effort is normal.     Breath sounds: Normal breath sounds.  Musculoskeletal:     Cervical back: Normal range of motion.  Skin:    Capillary Refill: Capillary refill takes less than 2 seconds.  Neurological:     General: No focal deficit present.     Mental Status: She is alert.  Psychiatric:        Mood and Affect: Mood normal.        Behavior: Behavior normal.       ASSESSMENT/PLAN:  BV (bacterial vaginosis) Assessment & Plan: Reviewed labs and allergies.  Given her allergic reaction, will continue with clindamycin topical to use 1 applicatorful for 7 total days for bacterial vaginosis.  Patient's questions answered to their satisfaction.   Orders: -     Clindamycin Phosphate (1 Dose); Use one applicatorful daily for a total of 7 days  Dispense: 5 g; Refill: 7  Chlamydia Assessment & Plan: Given her reaction to the medications, unknown if doxycycline was the source, we will continue with a gram of azithromycin.  Patient was given the medication while in office and monitored for 15 minutes.  Vital signs remained stable and she was given strict return precautions if she started to experience side effects.  Orders: -     Azithromycin  Allergic reaction, initial encounter Assessment & Plan: No signs of anaphylaxis or respiratory compromise on examination.  The allergic event happened 5 days ago.  We will continue with EpiPen, prescription sent to pharmacy.  We will also continue with referral to allergist for allergic testing.  Orders: -     EPINEPHrine; Inject 0.3 mg into the muscle as needed for anaphylaxis.  Dispense: 1  each; Refill: 1 -     Ambulatory referral to Allergy   Return in about 4 weeks (around 04/01/2022), or if symptoms worsen or fail to improve. Alfredo Martinez, MD 03/04/2022, 11:58 AM PGY-2, [facility]

## 2022-03-04 NOTE — Assessment & Plan Note (Signed)
Given her reaction to the medications, unknown if doxycycline was the source, we will continue with a gram of azithromycin.  Patient was given the medication while in office and monitored for 15 minutes.  Vital signs remained stable and she was given strict return precautions if she started to experience side effects.

## 2022-03-04 NOTE — Patient Instructions (Addendum)
It was great to see you today! Thank you for choosing [facility] for your primary care. Valerie [name] was seen for allergic reaction.  Continue with the clindamycin cream 1 applicatorful vaginally for a total of 7 days We will give you a azithromycin here in office to treat the chlamydia I will send a referral to allergy specialist for allergic testing in addition to an EpiPen to your pharmacy in case you have an allergic reaction.  They will teach her how to use this at the pharmacy.  For future reactions if needed  Take over-the-counter Allegra 180 mg daily or Zyrtec or Claritin 10 mg daily to help with itching. If you develop any swelling of your lips or tongue, tightness in your throat, or difficulty breathing you need to go to the ER for evaluation.  If you haven't already, sign up for My Chart to have easy access to your labs results, and communication with your primary care physician.  I recommend that you always bring your medications to each appointment as this makes it easy to ensure you are on the correct medications and helps [place] not miss refills when you need them. Call the clinic at [phone] if your symptoms worsen or you have any concerns.  You should return to our clinic Return in about 4 weeks (around 04/01/2022), or if symptoms worsen or fail to improve. Please arrive 15 minutes before your appointment to ensure smooth check in process.  We appreciate your efforts in making this happen.  Thank you for allowing me to participate in your care, Alfredo Martinez, MD 03/04/2022, 10:11 AM PGY-2, [facility]

## 2022-03-04 NOTE — Assessment & Plan Note (Signed)
Reviewed labs and allergies.  Given her allergic reaction, will continue with clindamycin topical to use 1 applicatorful for 7 total days for bacterial vaginosis.  Patient's questions answered to their satisfaction.

## 2022-04-25 DIAGNOSIS — M545 Low back pain, unspecified: Secondary | ICD-10-CM | POA: Diagnosis not present

## 2022-04-25 DIAGNOSIS — M25511 Pain in right shoulder: Secondary | ICD-10-CM | POA: Diagnosis not present

## 2022-04-25 DIAGNOSIS — M542 Cervicalgia: Secondary | ICD-10-CM | POA: Diagnosis not present

## 2022-05-07 ENCOUNTER — Ambulatory Visit (HOSPITAL_COMMUNITY)
Admission: EM | Admit: 2022-05-07 | Discharge: 2022-05-07 | Disposition: A | Discharge status: Home / Self Care | Payer: Medicaid Other | Attending: Emergency Medicine | Admitting: Emergency Medicine

## 2022-05-07 ENCOUNTER — Encounter (HOSPITAL_COMMUNITY): Payer: Self-pay

## 2022-05-07 DIAGNOSIS — Z1152 Encounter for screening for COVID-19: Secondary | ICD-10-CM | POA: Insufficient documentation

## 2022-05-07 DIAGNOSIS — J029 Acute pharyngitis, unspecified: Secondary | ICD-10-CM | POA: Insufficient documentation

## 2022-05-07 DIAGNOSIS — Z8616 Personal history of COVID-19: Secondary | ICD-10-CM | POA: Insufficient documentation

## 2022-05-07 LAB — POCT RAPID STREP A, ED / UC: Streptococcus, Group A Screen (Direct): NEGATIVE

## 2022-05-07 NOTE — ED Triage Notes (Signed)
3- day h/o sore throat and coughing. Pt has not taking any mediation at this time for symptoms.

## 2022-05-07 NOTE — ED Provider Notes (Signed)
[facility]    CSN: [id number] Arrival date & time: 05/07/22  2202      History   Chief Complaint Chief Complaint  Patient presents with   Cough   Sore Throat    HPI Valerie [name] is a 22 y.o. female.  Patient presents complaining of a 3-day history of sore throat.  Patient reports that her symptoms have been intermittent.  She reports that she has painful swallowing at times.  She feels like being outside in the cold air worsens her symptoms.  She denies any recent exposure to viral illness or strep.  She denies any fever, chills, SOB, difficulty breathing or any other cold symptoms.  Patient reports that she has been using warm fluids with some relief of symptoms.  She denies taking any over-the-counter medications at this time.    Cough Associated symptoms: sore throat   Associated symptoms: no chest pain, no chills, no ear pain, no fever, no rhinorrhea, no shortness of breath and no wheezing   Sore Throat Pertinent negatives include no chest pain and no shortness of breath.    Past Medical History:  Diagnosis Date   COVID-19    Medical history non-contributory     Patient Active Problem List   Diagnosis Date Noted   BV (bacterial vaginosis) 03/04/2022   Chlamydia 03/04/2022   Allergic reaction 01/15/2019   Contraception 06/12/2016    Past Surgical History:  Procedure Laterality Date   NO PAST SURGERIES      OB History     Gravida  1   Para      Term      Preterm      AB      Living         SAB      IAB      Ectopic      Multiple      Live Births               Home Medications    Prior to Admission medications   Medication Sig Start Date End Date Taking? Authorizing Provider  EPINEPHrine (EPIPEN 2-PAK) 0.3 mg/0.3 mL IJ SOAJ injection Inject 0.3 mg into the muscle as needed for anaphylaxis. 03/04/22   Erskine Emery, MD  ibuprofen (ADVIL) 600 MG tablet Take 1 tablet (600 mg total) by mouth every 6 (six) hours as  needed. 07/27/21   Gabriel Carina, CNM  Norgestimate-Ethinyl Estradiol Triphasic (ORTHO TRI-CYCLEN LO) 0.18/0.215/0.25 MG-25 MCG tab Take 1 tablet by mouth daily. 02/01/22   August Albino, MD    Family History Family History  Problem Relation Age of Onset   Diabetes Mother    Healthy Father     Social History Social History   Tobacco Use   Smoking status: Former    Years: 2.00    Types: Cigarettes    Quit date: 07/15/2021    Years since quitting: 0.8   Smokeless tobacco: Never   Tobacco comments:    Vaping daily.   Vaping Use   Vaping Use: Former  Substance Use Topics   Alcohol use: Not Currently    Comment: had been drinking heavy "had events"   Drug use: Yes    Frequency: 7.0 times per week    Types: Marijuana     Allergies   Doxycycline and Metronidazole   Review of Systems Review of Systems  Constitutional:  Negative for activity change, appetite change, chills, fatigue and fever.  HENT:  Positive for sore  throat. Negative for congestion, ear discharge, ear pain, postnasal drip, rhinorrhea, sinus pressure, sinus pain and trouble swallowing.   Eyes: Negative.   Respiratory:  Negative for cough, chest tightness, shortness of breath, wheezing and stridor.   Cardiovascular:  Negative for chest pain and palpitations.  Gastrointestinal: Negative.      Physical Exam Triage Vital Signs ED Triage Vitals [05/07/22 1922]  Enc Vitals Group     BP 138/76     [name] Rate 84     Resp 18     Temp 97.8 F (36.6 C)     Temp Source Oral     SpO2 99 %     Weight      Height      Head Circumference      Peak Flow      Pain Score      Pain Loc      Pain Edu?      Excl. in [place]?    No data found.  Updated Vital Signs BP 138/76 (BP Location: Left Arm)   [name] 84   Temp 97.8 F (36.6 C) (Oral)   Resp 18   LMP 04/16/2022 (Within Days)   SpO2 99%     Physical Exam Vitals and nursing note reviewed.  Constitutional:      Appearance: She is well-developed.   HENT:     Mouth/Throat:     Mouth: Mucous membranes are moist.     Dentition: Normal dentition.     Pharynx: Uvula midline. Posterior oropharyngeal erythema present. No pharyngeal swelling, oropharyngeal exudate or uvula swelling.     Tonsils: No tonsillar exudate or tonsillar abscesses. 0 on the right. 0 on the left.  Neurological:     Mental Status: She is alert.      UC Treatments / Results  Labs (all labs ordered are listed, but only abnormal results are displayed) Labs Reviewed  SARS CORONAVIRUS 2 (TAT 6-24 HRS)  POCT RAPID STREP A, ED / UC    EKG   Radiology No results found.  Procedures Procedures (including critical care time)  Medications Ordered in UC Medications - No data to display  Initial Impression / Assessment and Plan / UC Course  I have reviewed the triage vital signs and the nursing notes.  Pertinent labs & imaging results that were available during my care of the patient were reviewed by me and considered in my medical decision making (see chart for details).     Patient was evaluated for acute pharyngitis.  POC strep was performed which showed a negative result.  Strep culture pending to verify no bacterial etiology.  COVID-19 test pending per patient's request.  Patient was educated on symptom management of a viral illness.  Patient left before discharge and requested that she was called with results, this writer called the patient, identity verified, and reported strep test results, discharge instructions, and information about MyChart.  Patient verbalized understanding of instructions over the phone.  Charting was provided using a a verbal dictation system, charting was proofread for errors, errors may occur which could change the meaning of the information charted.   Final Clinical Impressions(s) / UC Diagnoses   Final diagnoses:  Acute pharyngitis, unspecified etiology   Discharge Instructions   None    ED Prescriptions   None    PDMP  not reviewed this encounter.   Flossie Dibble, NP 05/08/22 [id number]

## 2022-05-08 LAB — SARS CORONAVIRUS 2 (TAT 6-24 HRS): SARS Coronavirus 2: NEGATIVE

## 2022-05-10 LAB — CULTURE, GROUP A STREP (THRC)

## 2022-05-13 DIAGNOSIS — Z0283 Encounter for blood-alcohol and blood-drug test: Secondary | ICD-10-CM | POA: Insufficient documentation

## 2022-06-20 ENCOUNTER — Ambulatory Visit (HOSPITAL_COMMUNITY): Payer: Medicaid Other

## 2022-07-02 ENCOUNTER — Ambulatory Visit: Payer: Medicaid Other | Admitting: Student

## 2022-07-02 NOTE — Progress Notes (Deleted)
    SUBJECTIVE:   CHIEF COMPLAINT / HPI:   ***  PERTINENT  PMH / PSH: ***  OBJECTIVE:   There were no vitals taken for this visit. ***  General: NAD, pleasant, able to participate in exam Cardiac: RRR, no murmurs. Respiratory: CTAB, normal effort, No wheezes, rales or rhonchi Abdomen: Bowel sounds present, nontender, nondistended, no hepatosplenomegaly. Extremities: no edema or cyanosis. Skin: warm and dry, no rashes noted Neuro: alert, no obvious focal deficits Psych: Normal affect and mood  ASSESSMENT/PLAN:   No problem-specific Assessment & Plan notes found for this encounter.     Dr. Chanoch Mccleery, DO [facility]    {    This will disappear when note is signed, click to select method of visit    :1}  

## 2022-07-04 ENCOUNTER — Ambulatory Visit: Payer: Medicaid Other

## 2022-07-08 IMAGING — CR DG CHEST 1V PORT
1 series · 1 of 1 positions shown · non-contrast
Comparison: None.

CLINICAL DATA: COVID positive.  Shortness of breath.

EXAM:
PORTABLE CHEST 1 VIEW

[AP]
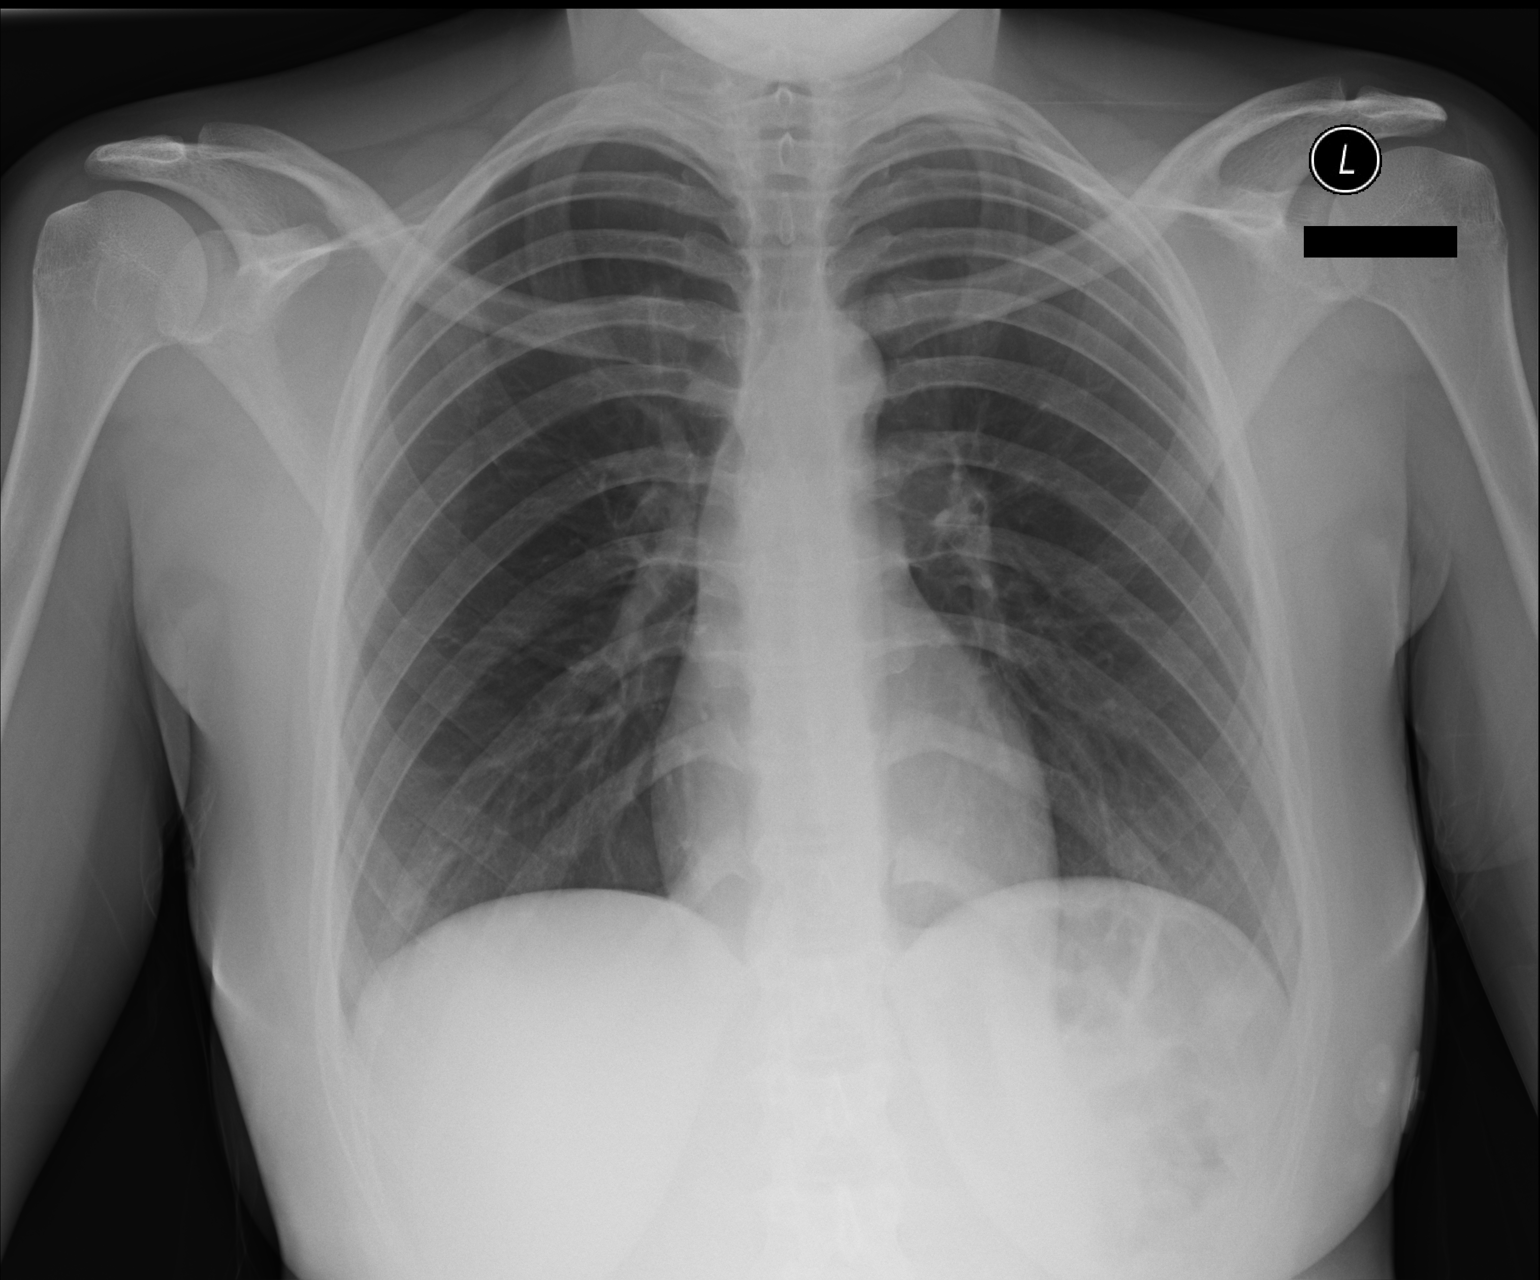

[1 of 1 positions shown; findings below may reference images not displayed]

FINDINGS: The heart size and mediastinal contours are within normal limits.
Both lungs are clear. The visualized skeletal structures are
unremarkable.
IMPRESSION: No active disease.

## 2022-12-31 ENCOUNTER — Ambulatory Visit: Payer: Medicaid Other | Admitting: Family Medicine

## 2023-08-08 ENCOUNTER — Encounter: Payer: Self-pay | Admitting: Student

## 2023-08-08 ENCOUNTER — Ambulatory Visit (INDEPENDENT_AMBULATORY_CARE_PROVIDER_SITE_OTHER): Admitting: Student

## 2023-08-08 ENCOUNTER — Other Ambulatory Visit (HOSPITAL_COMMUNITY)
Admission: RE | Admit: 2023-08-08 | Discharge: 2023-08-08 | Disposition: A | Discharge status: Home / Self Care | Source: Ambulatory Visit | Attending: Family Medicine | Admitting: Family Medicine

## 2023-08-08 VITALS — BP 129/77 | HR 82 | Ht 67.0 in | Wt 176.1 lb

## 2023-08-08 DIAGNOSIS — Z124 Encounter for screening for malignant neoplasm of cervix: Secondary | ICD-10-CM | POA: Diagnosis present

## 2023-08-08 DIAGNOSIS — Z113 Encounter for screening for infections with a predominantly sexual mode of transmission: Secondary | ICD-10-CM | POA: Diagnosis present

## 2023-08-08 DIAGNOSIS — Z30016 Encounter for initial prescription of transdermal patch hormonal contraceptive device: Secondary | ICD-10-CM

## 2023-08-08 DIAGNOSIS — Z3009 Encounter for other general counseling and advice on contraception: Secondary | ICD-10-CM

## 2023-08-08 LAB — POCT WET PREP (WET MOUNT)
Clue Cells Wet Prep Whiff POC: NEGATIVE
Trichomonas Wet Prep HPF POC: ABSENT

## 2023-08-08 LAB — POCT URINE PREGNANCY: Preg Test, Ur: NEGATIVE

## 2023-08-08 MED ORDER — NORELGESTROMIN-ETH ESTRADIOL 150-35 MCG/24HR TD PTWK: 1.0000 | MEDICATED_PATCH | TRANSDERMAL | 12 refills | Status: AC

## 2023-08-08 NOTE — Assessment & Plan Note (Addendum)
 Pregnancy test today negative.  No contraindication for hormonal birth control.  Patient is preferable for patch contraceptive ordered Xulane patch.

## 2023-08-08 NOTE — Patient Instructions (Addendum)
 Pleasure to meet you today.  Today we collected samples to test you for gonorrhea, chlamydia, trichomonas.  Labs to test for HIV and syphilis.  Please go to the [facility] at [address] [place] to obtain your lab.  Also your Pap smear was obtained today.  I have started you on Xulane patch control patch which you will apply weekly.

## 2023-08-08 NOTE — Progress Notes (Signed)
    SUBJECTIVE:   CHIEF COMPLAINT / HPI:   23 year old female presents for sexually transmitted disease (STD) testing. She is sexually active with one female partner and does not consistently use condoms.  Currently asymptomatic.  She denies the possibility of pregnancy, having had her last menstrual period on April 11th and no sexual intercourse since then. A pregnancy test was performed and was negative.  Expressed desire to start on patch contraceptive.  Previously tried OCP and Depo-Provera  shots.  Denies any histories of migraine, breast cancer or blood clots.  The patient also mentions a long time since her last Pap smear.  PERTINENT  PMH / PSH: Reviewed   OBJECTIVE:   BP 129/77   Pulse 82   Ht 5\' 7"  (1.702 m)   Wt 176 lb 2 oz (79.9 kg)   LMP 07/25/2023   SpO2 100%   BMI 27.59 kg/m    Physical Exam General: Alert, well appearing, NAD, Oriented x4 Cardiovascular: RRR, No Murmurs, Normal S2/S2 Respiratory: CTAB, No wheezing or Rales Abdomen: No distension or tenderness Genitalia:  Normal introitus for age, no external lesions, mucosa pink and moist, no vaginal or cervical lesions, no friaility or hemorrhage, normal uterus size and position   ASSESSMENT/PLAN:   Encounter for initial prescription of transdermal patch hormonal contraceptive device Pregnancy test today negative.  No contraindication for hormonal birth control.  Patient is preferable for patch contraceptive ordered Xulane patch.   STD testing Sexually active with inconsistent condom use.  Currently asymptomatic and negative pregnancy test. - Perform STD testing including GC chlamydia, gonorrhea, trichomonas, HIV and RPR - Patient provided order for HIV and RPR to be completed at nearby [facility].  Cervical cancer screening  Overdue Pap smear. Samples obtained for Pap smear.    Goble Last, MD [facility]

## 2023-08-12 LAB — CYTOLOGY - PAP
Chlamydia: POSITIVE — AB
Comment: NEGATIVE
Comment: NORMAL
Diagnosis: NEGATIVE
Neisseria Gonorrhea: NEGATIVE

## 2023-08-13 ENCOUNTER — Telehealth: Payer: Self-pay

## 2023-08-13 NOTE — Telephone Encounter (Signed)
 Patient calls nurse requesting results from 4/25 OV.  Discussed WNL pap smear. However, advised of positive Chlamydia.  Advised oral medication would be sent to her pharmacy for treatment.   Discussed safe sex practices and refrain from sexual activity until partner(s) are treated.   She is requesting partner treatment. Advised to call me back with details.   Will forward to provider for patient treatment.

## 2023-08-14 ENCOUNTER — Encounter: Payer: Self-pay | Admitting: Student

## 2023-08-14 ENCOUNTER — Other Ambulatory Visit: Payer: Self-pay | Admitting: Student

## 2023-08-14 DIAGNOSIS — A749 Chlamydial infection, unspecified: Secondary | ICD-10-CM

## 2023-08-14 MED ORDER — AZITHROMYCIN 500 MG PO TABS: 1000.0000 mg | ORAL_TABLET | Freq: Once | ORAL | 0 refills | Status: AC

## 2023-08-14 NOTE — Progress Notes (Signed)
 Changed doxycycline  to Azithromycin  1g for treatment of Chlamydia due to history of SE to doxycycline 

## 2023-10-02 ENCOUNTER — Ambulatory Visit

## 2023-10-02 ENCOUNTER — Ambulatory Visit: Admitting: Family Medicine

## 2023-10-02 VITALS — BP 116/74 | HR 83 | Ht 67.0 in | Wt 180.0 lb

## 2023-10-02 DIAGNOSIS — A749 Chlamydial infection, unspecified: Secondary | ICD-10-CM

## 2023-10-02 DIAGNOSIS — Z3009 Encounter for other general counseling and advice on contraception: Secondary | ICD-10-CM | POA: Diagnosis not present

## 2023-10-02 MED ORDER — AZITHROMYCIN 500 MG PO TABS: 1000.0000 mg | ORAL_TABLET | Freq: Once | ORAL | 0 refills | Status: AC

## 2023-10-02 NOTE — Patient Instructions (Signed)
 I have sent in azithromycin  for chlamydia. Be sure to take this on a stomach that is not too full. Let me know how this goes! Do not have sex until 1 week after treatment.  You can make an appointment for nexplanon insertion with me, Dr. Fredrik Jensen, at any time!

## 2023-10-02 NOTE — Progress Notes (Signed)
    SUBJECTIVE:   CHIEF COMPLAINT / HPI:   Concern about STI results Previously diagnosed with chlamydia and sent in azithromycin  1 g.  She was unable to take this due to having a lot of food on her stomach and ultimately vomiting.  Her partner, however, was treated.  She has not been sexually active with him since being treated.  She would like to have a refill of his medication without being swapped, if possible.  She has continued to have the discharge that she had at her last visit.  Discuss contraception She has tried the pills in the patches before without much success.  She has heard difficult things about the injectable with weight gain.  She is interested in potentially the Nexplanon or an IUD.  PERTINENT  PMH / PSH: Bacterial vaginosis, chlamydia, allergies  OBJECTIVE:   BP 116/74   Pulse 83   Ht 5' 7 (1.702 m)   Wt 180 lb (81.6 kg)   SpO2 99%   BMI 28.19 kg/m   General: Alert and oriented, in NAD Skin: Warm, dry, and intact without lesions HEENT: NCAT, EOM grossly normal, midline nasal septum Cardiac: RRR, no m/r/g appreciated Respiratory: CTAB, breathing and speaking comfortably on RA Abdominal: Soft, nontender, nondistended, normoactive bowel sounds Extremities: Moves all extremities grossly equally Neurological: No gross focal deficit Psychiatric: Appropriate mood and affect   ASSESSMENT/PLAN:   Assessment & Plan Chlamydia Given persistent symptoms, inability to take doxycycline  due to reaction, and inability to take azithromycin  in the past, will refill azithromycin  1 g once.  She feels she will tolerate this better with a less full stomach, so we will hold off on Zofran  for now.  Advised to hold off on sexual intercourse with partner until 1 week after treatment. General counseling and advice for contraceptive management After extensive discussion, decided to proceed with Nexplanon insertion.  She will schedule this with me.  Handout provided for more  information in the meantime.   Genetta Kenning, MD [facility]

## 2023-10-02 NOTE — Assessment & Plan Note (Signed)
 Given persistent symptoms, inability to take doxycycline  due to reaction, and inability to take azithromycin  in the past, will refill azithromycin  1 g once.  She feels she will tolerate this better with a less full stomach, so we will hold off on Zofran  for now.  Advised to hold off on sexual intercourse with partner until 1 week after treatment.

## 2023-10-02 NOTE — Assessment & Plan Note (Signed)
 After extensive discussion, decided to proceed with Nexplanon insertion.  She will schedule this with me.  Handout provided for more information in the meantime.

## 2023-10-31 ENCOUNTER — Ambulatory Visit: Admitting: Family Medicine

## 2023-10-31 NOTE — Progress Notes (Incomplete)
    SUBJECTIVE:   CHIEF COMPLAINT / HPI:   Nexplanon insertion Previously seen for treatment of chlamydia and counseling for birth control. She elected to undergo placement of nexplanon today.  PERTINENT  PMH / PSH: ***  OBJECTIVE:   There were no vitals taken for this visit.  General: Alert and oriented, in NAD Skin: Warm, dry, and intact without lesions HEENT: NCAT, EOM grossly normal, midline nasal septum Cardiac: RRR, no m/r/g appreciated Respiratory: CTAB, breathing and speaking comfortably on RA Abdominal: Soft, nontender, nondistended, normoactive bowel sounds Extremities: Moves all extremities grossly equally Neurological: No gross focal deficit Psychiatric: Appropriate mood and affect   ASSESSMENT/PLAN:   Assessment & Plan    GYNECOLOGY PROCEDURE NOTE  Valerie [name] is a 23 y.o. G1P0 here for Nexplanon insertion. No other gynecologic concerns***. ADD NEXPLANON TO PROBLEM LIST AND REMOVAL DATE IN OVERVIEW***  Nexplanon Insertion Patient identified, informed consent performed, consent signed.   Patient does understand that irregular bleeding is a very common side effect of this medication. She was advised to have backup contraception for one week after placement. Pregnancy test in clinic today was negative.  Appropriate time out taken.  Patient's *** arm was prepped and draped in the usual sterile fashion. The insertion area was measured and marked.  Patient was prepped with alcohol swab and then injected with 3 ml of 1% lidocaine.  The area was then prepped with betadine, Nexplanon removed from packaging,  device confirmed present within needle, then inserted full length of needle and withdrawn per handbook instructions. Nexplanon was able to palpated in the patient's arm; patient palpated the insert herself. There was minimal blood loss.  Patient insertion site covered with guaze and a pressure bandage to reduce any bruising.  The patient tolerated the procedure well  and was given post procedure instructions.   Stuart Redo, MD 10/31/2023, 8:37 AM PGY-3, [facility]

## 2023-11-13 ENCOUNTER — Encounter: Admitting: Obstetrics and Gynecology

## 2023-11-21 ENCOUNTER — Ambulatory Visit

## 2023-11-21 NOTE — Progress Notes (Deleted)
    SUBJECTIVE:   CHIEF COMPLAINT / HPI:   Here for STD testing ***  PERTINENT  PMH / PSH: ***  OBJECTIVE:   There were no vitals taken for this visit.  ***  ASSESSMENT/PLAN:   Assessment & Plan    Payton Coward, MD [facility]

## 2023-12-04 ENCOUNTER — Ambulatory Visit (INDEPENDENT_AMBULATORY_CARE_PROVIDER_SITE_OTHER): Admitting: Family Medicine

## 2023-12-04 ENCOUNTER — Other Ambulatory Visit (HOSPITAL_COMMUNITY)
Admission: RE | Admit: 2023-12-04 | Discharge: 2023-12-04 | Disposition: A | Discharge status: Home / Self Care | Source: Ambulatory Visit | Attending: Family Medicine | Admitting: Family Medicine

## 2023-12-04 VITALS — BP 122/82 | HR 85 | Ht 66.0 in | Wt 174.8 lb

## 2023-12-04 DIAGNOSIS — Z113 Encounter for screening for infections with a predominantly sexual mode of transmission: Secondary | ICD-10-CM | POA: Insufficient documentation

## 2023-12-04 LAB — POCT WET PREP (WET MOUNT)
Clue Cells Wet Prep Whiff POC: NEGATIVE
Trichomonas Wet Prep HPF POC: ABSENT
WBC, Wet Prep HPF POC: NONE SEEN

## 2023-12-04 NOTE — Progress Notes (Signed)
    SUBJECTIVE:   CHIEF COMPLAINT / HPI:   STI testing Would like to have testing done today. Recently treated in June 2025 for chlamydia.  No discharge, itching, or other symptoms today.  She remains sexually active, though she is using condoms at this point.  She remains interested in Nexplanon insertion and will schedule this in the future.  PERTINENT  PMH / PSH: Chlamydia, bacterial vaginosis  OBJECTIVE:   BP 122/82   Pulse 85   Ht 5' 6 (1.676 m)   Wt 174 lb 12.8 oz (79.3 kg)   SpO2 100%   BMI 28.21 kg/m   General: Alert and oriented, in NAD Skin: Warm, dry, and intact  HEENT: NCAT, EOM grossly normal, midline nasal septum Respiratory: Breathing and speaking comfortably on RA Extremities: Moves all extremities grossly equally Genitourinary: External vagina normal without lesions, vaginal vault with scant clear white discharge, cervix appreciated and appears normal without bleeding, patient tolerated exam well with chaperone CMA Simpson Neurological: No gross focal deficit Psychiatric: Appropriate mood and affect   ASSESSMENT/PLAN:   Assessment & Plan Routine screening for STI (sexually transmitted infection) Aptima swabs today collected for gonorrhea, chlamydia, trichomonas.  Wet prep obtained and is negative.  HIV, RPR, and hepatitis C antibody collected today, as well.  She will continue using condoms.  She will also make an appointment with me soon for Nexplanon insertion.   Stuart Redo, MD [facility]

## 2023-12-04 NOTE — Patient Instructions (Addendum)
 We tested for STIs today. I will keep you updated on results. Keep up the great work! Let me know if you would like to chat about nexplanon insertion.

## 2023-12-05 ENCOUNTER — Ambulatory Visit: Payer: Self-pay | Admitting: Family Medicine

## 2023-12-05 LAB — CERVICOVAGINAL ANCILLARY ONLY
Chlamydia: NEGATIVE
Comment: NEGATIVE
Comment: NEGATIVE
Comment: NORMAL
Neisseria Gonorrhea: NEGATIVE
Trichomonas: NEGATIVE

## 2023-12-05 LAB — RPR: RPR Ser Ql: NONREACTIVE

## 2023-12-05 LAB — HEPATITIS C ANTIBODY: Hep C Virus Ab: NONREACTIVE

## 2023-12-05 LAB — HIV ANTIBODY (ROUTINE TESTING W REFLEX): HIV Screen 4th Generation wRfx: NONREACTIVE

## 2024-01-29 IMAGING — US US OB < 14 WEEKS - US OB TV
1 series · 15 of 28 positions shown · non-contrast
Comparison: None.

CLINICAL DATA: 21-year-old pregnant female presents with 2 days of
vaginal bleeding. EDC by LMP: 03/22/2022, projecting to an expected
gestational age of 5 weeks 5 days. Quantitative beta HCG not
available at the time of this dictation.

EXAM:
OBSTETRIC <14 WK US AND TRANSVAGINAL OB US
TECHNIQUE: Both transabdominal and transvaginal ultrasound examinations were
performed for complete evaluation of the gestation as well as the
maternal uterus, adnexal regions, and pelvic cul-de-sac.
Transvaginal technique was performed to assess early pregnancy.

[Series 1: us ob < 14 weeks - us ob tv · 41 acquisitions, 15 frames shown]
[im 1/41]
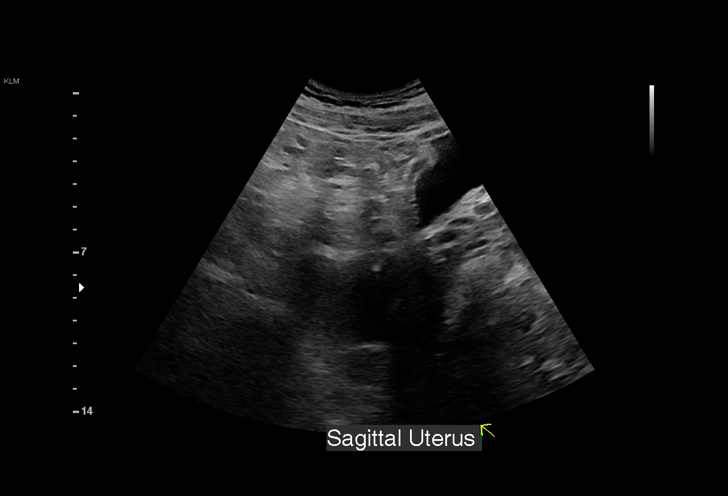
[im 3/41]
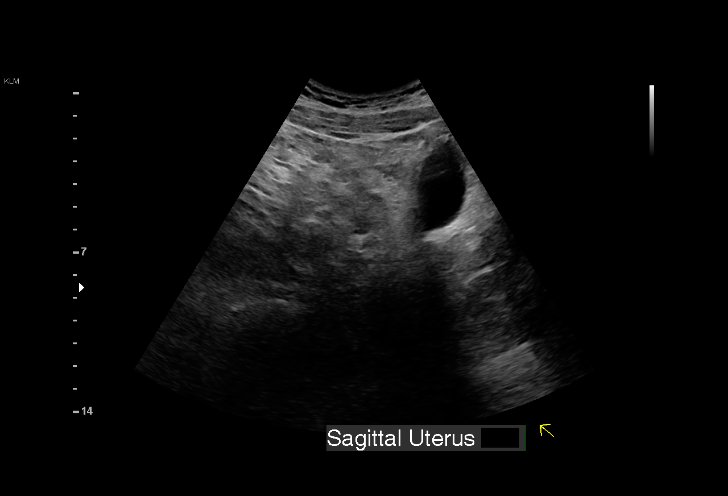
[im 6/41]
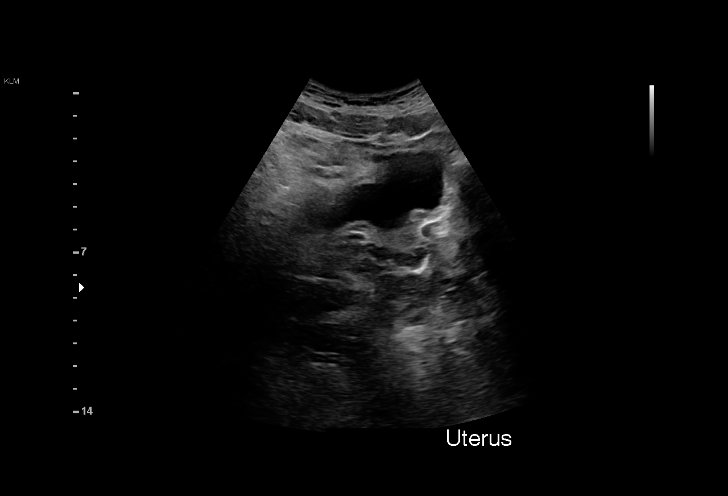
[im 9/41]
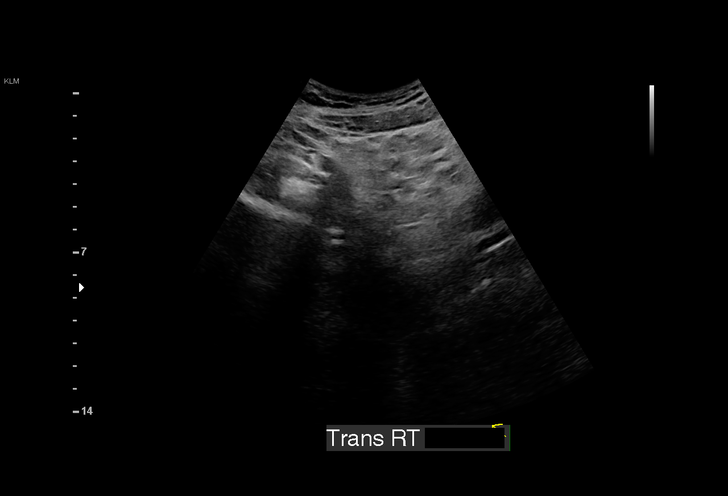
[im 12/41]
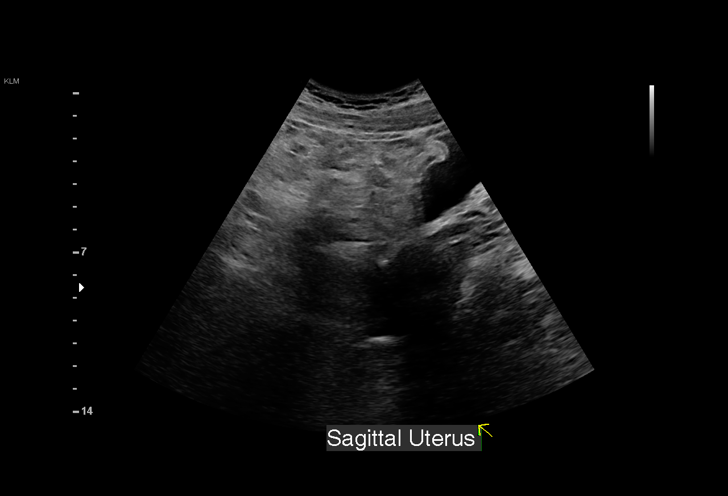
[im 15/41]
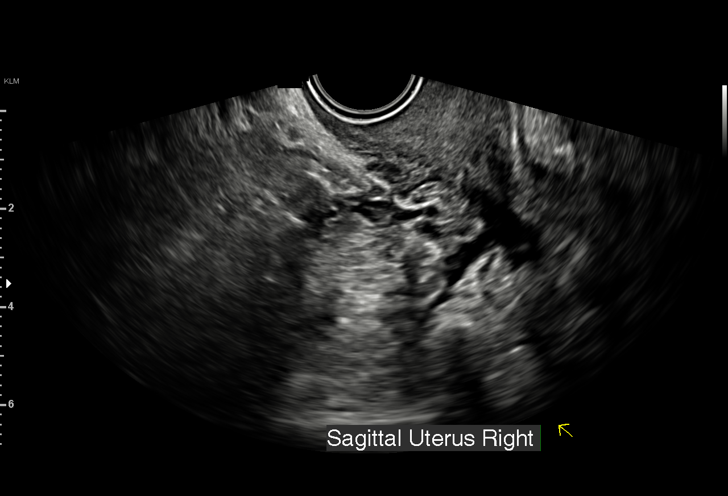
[im 18/41]
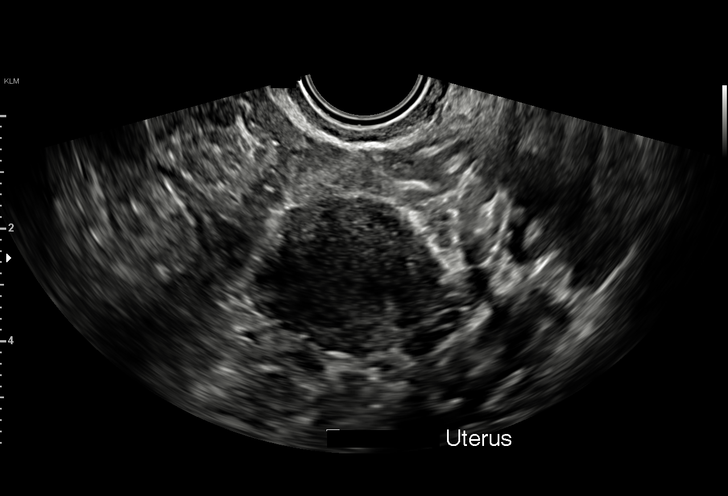
[im 21/41]
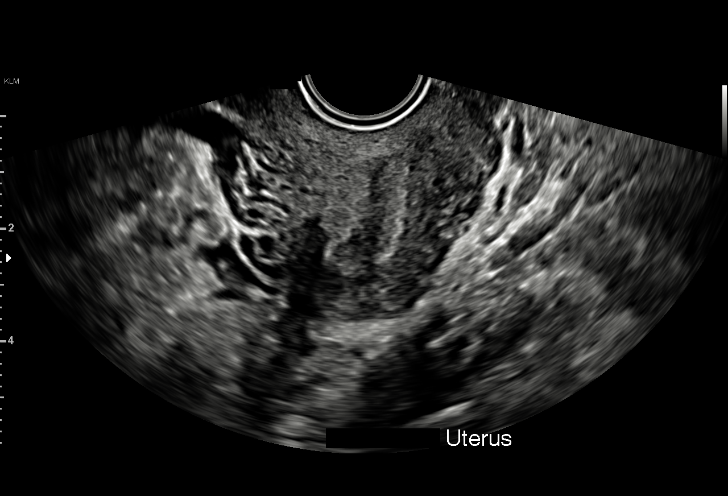
[im 23/41]
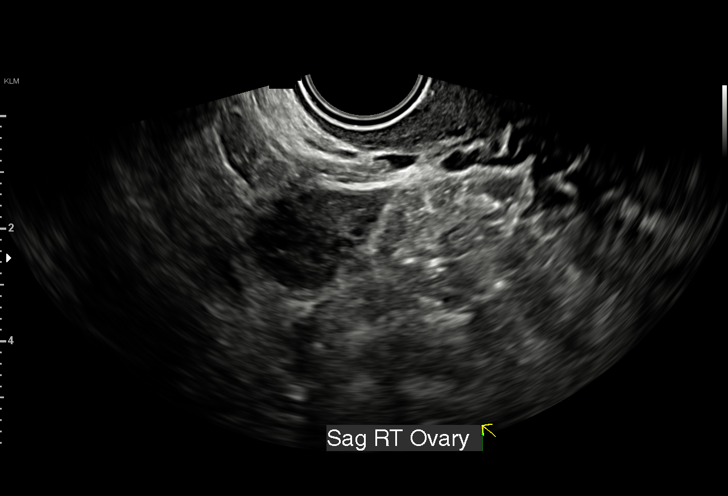
[im 26/41]
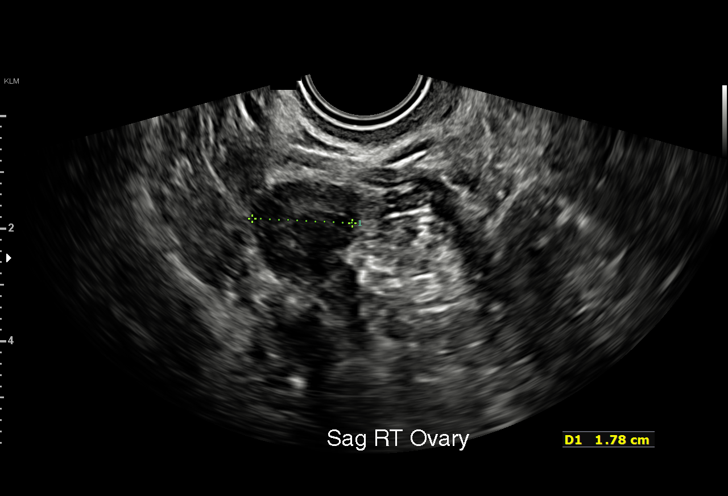
[im 29/41]
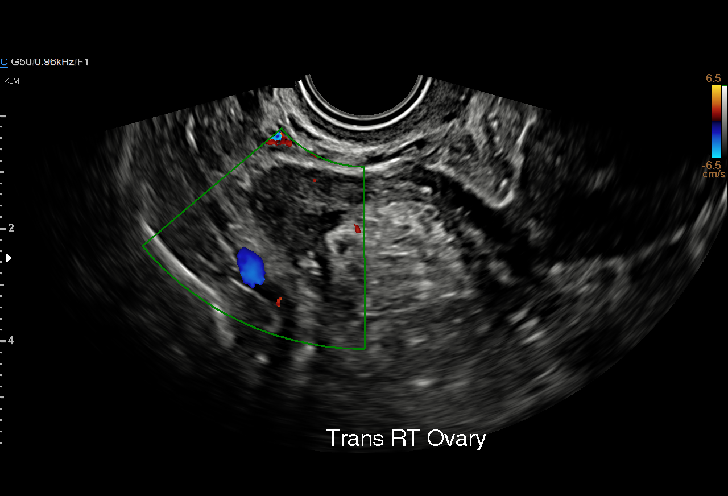
[im 32/41]
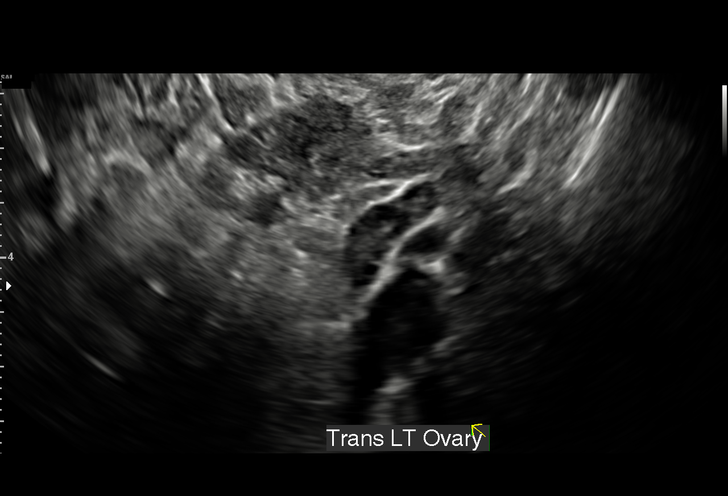
[im 35/41]
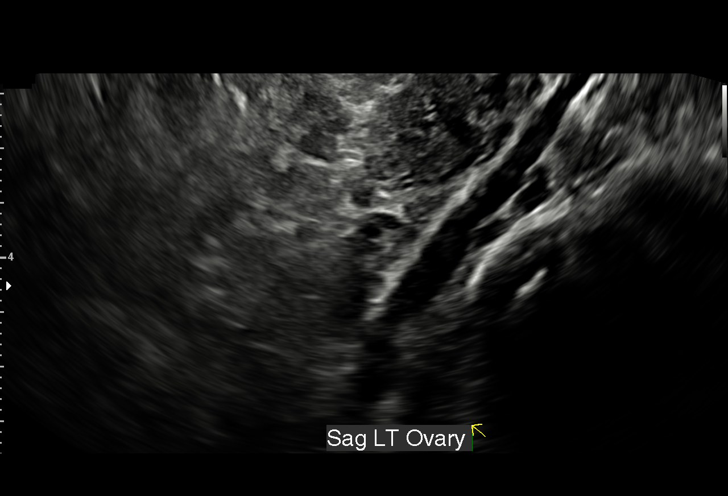
[im 38/41]
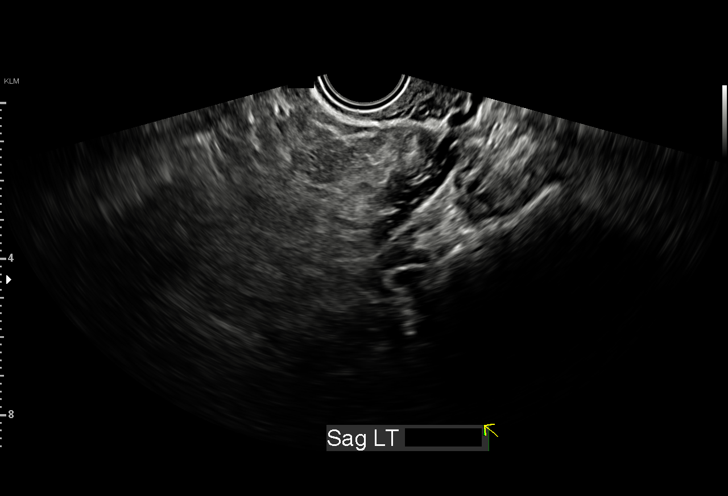
[im 41/41]
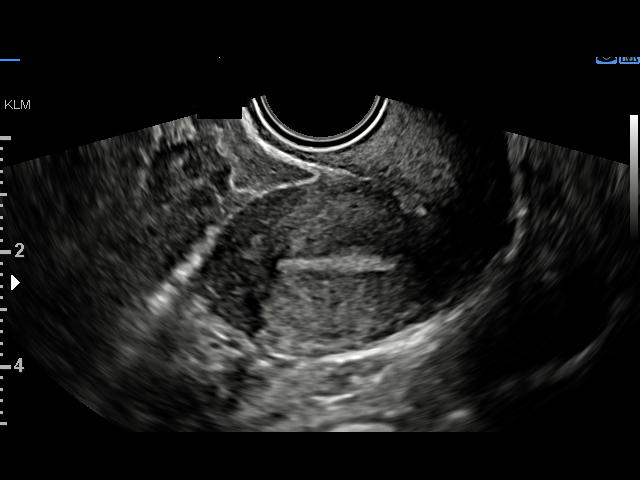

[15 of 28 positions shown; findings below may reference images not displayed]

FINDINGS: Intrauterine gestational sac: None

Maternal uterus/adnexae: Anteverted uterus. No uterine fibroids.
Bilayer endometrial thickness 11 mm. Mildly heterogeneous
endometrium. No focal endometrial mass. Right ovary measures 2.2 x
1.5 x 1.8 cm and appears normal. Left ovary measures 1.8 x 1.1 x
cm and appears normal. No adnexal masses. No abnormal free fluid in
the pelvis.
IMPRESSION: Non-localization of the pregnancy on this scan. No intrauterine
gestational sac. No ovarian or adnexal masses. Sonographic
differential diagnosis includes intrauterine gestation too early to
visualize, spontaneous abortion or occult ectopic gestation.
Recommend close clinical follow-up and serial serum beta HCG
monitoring, with repeat obstetric scan as warranted by beta HCG
levels and clinical assessment.

## 2024-04-14 ENCOUNTER — Ambulatory Visit (HOSPITAL_COMMUNITY)
Admission: EM | Admit: 2024-04-14 | Discharge: 2024-04-14 | Disposition: A | Discharge status: Home / Self Care | Attending: Internal Medicine | Admitting: Internal Medicine

## 2024-04-14 ENCOUNTER — Encounter (HOSPITAL_COMMUNITY): Payer: Self-pay | Admitting: *Deleted

## 2024-04-14 DIAGNOSIS — K529 Noninfective gastroenteritis and colitis, unspecified: Secondary | ICD-10-CM | POA: Diagnosis not present

## 2024-04-14 DIAGNOSIS — R112 Nausea with vomiting, unspecified: Secondary | ICD-10-CM | POA: Diagnosis not present

## 2024-04-14 MED ORDER — ONDANSETRON 4 MG PO TBDP
ORAL_TABLET | ORAL | Status: AC
  Filled 2024-04-14: qty 1

## 2024-04-14 MED ORDER — ONDANSETRON 4 MG PO TBDP
4.0000 mg | ORAL_TABLET | Freq: Once | ORAL | Status: AC
  Administered 2024-04-14: 4 mg via ORAL

## 2024-04-14 MED ORDER — ONDANSETRON 4 MG PO TBDP: 4.0000 mg | ORAL_TABLET | Freq: Three times a day (TID) | ORAL | 0 refills | Status: AC | PRN

## 2024-04-14 NOTE — ED Provider Notes (Signed)
 " [facility]    CSN: [id number] Arrival date & time: 04/14/24  1843      History   Chief Complaint Chief Complaint  Patient presents with   Emesis    HPI Valerie [name] is a 23 y.o. female.   23 year old female who presents urgent care with complaints of nausea, vomiting and diarrhea.  Her symptoms started on Friday with the nausea.  Her last episode of vomiting was yesterday.  She started having some diarrhea on Saturday. She reports that she has been feeling somewhat better today but due to the duration of the symptoms she came in for further evaluation.  She has not used any over-the-counter medication for this.   Emesis Associated symptoms: diarrhea   Associated symptoms: no abdominal pain, no arthralgias, no chills, no cough, no fever and no sore throat     Past Medical History:  Diagnosis Date   COVID-19    Medical history non-contributory     Patient Active Problem List   Diagnosis Date Noted   Encounter for initial prescription of transdermal patch hormonal contraceptive device 08/08/2023   Encounter for drug screening 05/13/2022   BV (bacterial vaginosis) 03/04/2022   Chlamydia 03/04/2022   Allergic reaction 01/15/2019   General counseling and advice for contraceptive management 06/12/2016    Past Surgical History:  Procedure Laterality Date   NO PAST SURGERIES      OB History     Gravida  1   Para      Term      Preterm      AB      Living         SAB      IAB      Ectopic      Multiple      Live Births               Home Medications    Prior to Admission medications  Medication Sig Start Date End Date Taking? Authorizing Provider  ondansetron  (ZOFRAN -ODT) 4 MG disintegrating tablet Take 1 tablet (4 mg total) by mouth every 8 (eight) hours as needed for nausea or vomiting. 04/14/24  Yes Aadan Chenier A, PA-C  EPINEPHrine  (EPIPEN  2-PAK) 0.3 mg/0.3 mL IJ SOAJ injection Inject 0.3 mg into the muscle as needed  for anaphylaxis. 03/04/22   Bryan Bianchi, MD  ibuprofen  (ADVIL ) 600 MG tablet Take 1 tablet (600 mg total) by mouth every 6 (six) hours as needed. 07/27/21   Vannie Cornell SAUNDERS, CNM  norelgestromin -ethinyl estradiol  (XULANE) 150-35 MCG/24HR transdermal patch Place 1 patch onto the skin once a week. 08/08/23   Rosendo Norleen BROCKS, MD    Family History Family History  Problem Relation Age of Onset   Diabetes Mother    Healthy Father     Social History Social History[1]   Allergies   Doxycycline  and Metronidazole    Review of Systems Review of Systems  Constitutional:  Negative for chills and fever.  HENT:  Negative for ear pain and sore throat.   Eyes:  Negative for pain and visual disturbance.  Respiratory:  Negative for cough and shortness of breath.   Cardiovascular:  Negative for chest pain and palpitations.  Gastrointestinal:  Positive for diarrhea, nausea and vomiting. Negative for abdominal pain.  Genitourinary:  Negative for dysuria and hematuria.  Musculoskeletal:  Negative for arthralgias and back pain.  Skin:  Negative for color change and rash.  Neurological:  Negative for seizures and syncope.  All other  systems reviewed and are negative.    Physical Exam Triage Vital Signs ED Triage Vitals  Encounter Vitals Group     BP 04/14/24 1919 115/77     Girls Systolic BP Percentile --      Girls Diastolic BP Percentile --      Boys Systolic BP Percentile --      Boys Diastolic BP Percentile --      Pulse Rate 04/14/24 1919 86     Resp 04/14/24 1919 16     Temp 04/14/24 1919 98.2 F (36.8 C)     Temp Source 04/14/24 1919 Oral     SpO2 04/14/24 1919 98 %     Weight --      Height --      Head Circumference --      Peak Flow --      Pain Score 04/14/24 1918 0     Pain Loc --      Pain Education --      Exclude from Growth Chart --    No data found.  Updated Vital Signs BP 115/77 (BP Location: Left Arm)   Pulse 86   Temp 98.2 F (36.8 C) (Oral)   Resp 16    LMP 03/22/2024 (Exact Date)   SpO2 98%   Breastfeeding Unknown   Visual Acuity Right Eye Distance:   Left Eye Distance:   Bilateral Distance:    Right Eye Near:   Left Eye Near:    Bilateral Near:     Physical Exam Vitals and nursing note reviewed.  Constitutional:      General: She is not in acute distress.    Appearance: She is well-developed.  HENT:     Head: Normocephalic and atraumatic.  Eyes:     Conjunctiva/sclera: Conjunctivae normal.  Cardiovascular:     Rate and Rhythm: Normal rate and regular rhythm.     Heart sounds: No murmur heard. Pulmonary:     Effort: Pulmonary effort is normal. No respiratory distress.     Breath sounds: Normal breath sounds.  Abdominal:     General: Bowel sounds are normal. There is no distension.     Palpations: Abdomen is soft.     Tenderness: There is no abdominal tenderness. There is no guarding.  Musculoskeletal:        General: No swelling.     Cervical back: Neck supple.  Skin:    General: Skin is warm and dry.     Capillary Refill: Capillary refill takes less than 2 seconds.  Neurological:     Mental Status: She is alert.  Psychiatric:        Mood and Affect: Mood normal.      UC Treatments / Results  Labs (all labs ordered are listed, but only abnormal results are displayed) Labs Reviewed - No data to display  EKG   Radiology No results found.  Procedures Procedures (including critical care time)  Medications Ordered in UC Medications  ondansetron  (ZOFRAN -ODT) disintegrating tablet 4 mg (4 mg Oral Given 04/14/24 1947)    Initial Impression / Assessment and Plan / UC Course  I have reviewed the triage vital signs and the nursing notes.  Pertinent labs & imaging results that were available during my care of the patient were reviewed by me and considered in my medical decision making (see chart for details).     Gastroenteritis  Nausea vomiting and diarrhea   Symptoms and physical exam findings are  most consistent with gastroenteritis causing  nausea, vomiting and diarrhea.  As you are able to stay hydrated, for now we recommend continuing to monitor symptoms and can try Zofran  for nausea.  Recommend over-the-counter Imodium A-D for diarrhea. This will likely improve in the next 24-4 hours. Make sure to stay hydrated by drinking plenty of water even if you do not feel like eating solid food.  Given the symptoms would advise not consuming large portions of alcoholic beverages until you are feeling better.  Can return to urgent care if needed.  Zofran  4 mg given at approximately 8 PM.  This medication is for nausea and vomiting.  You may take another dose if needed around 4 AM tomorrow  Final Clinical Impressions(s) / UC Diagnoses   Final diagnoses:  Gastroenteritis  Nausea vomiting and diarrhea     Discharge Instructions      Symptoms and physical exam findings are most consistent with gastroenteritis causing nausea, vomiting and diarrhea.  As you are able to stay hydrated, for now we recommend continuing to monitor symptoms and can try Zofran  for nausea.  Recommend over-the-counter Imodium A-D for diarrhea. This will likely improve in the next 24-4 hours. Make sure to stay hydrated by drinking plenty of water even if you do not feel like eating solid food.  Given the symptoms would advise not consuming large portions of alcoholic beverages until you are feeling better.  Can return to urgent care if needed.  Zofran  4 mg given at approximately 8 PM.  This medication is for nausea and vomiting.  You may take another dose if needed around 4 AM tomorrow    ED Prescriptions     Medication Sig Dispense Auth. Provider   ondansetron  (ZOFRAN -ODT) 4 MG disintegrating tablet Take 1 tablet (4 mg total) by mouth every 8 (eight) hours as needed for nausea or vomiting. 20 tablet Teresa Almarie LABOR, [place]      PDMP not reviewed this encounter.    [1]  Social History Tobacco Use   Smoking status:  Former    Current packs/day: 0.00    Types: Cigarettes    Start date: 07/16/2019    Quit date: 07/15/2021    Years since quitting: 2.7   Smokeless tobacco: Never   Tobacco comments:    Vaping daily.   Vaping Use   Vaping status: Former  Substance Use Topics   Alcohol use: Not Currently    Comment: had been drinking heavy had events   Drug use: Yes    Frequency: 7.0 times per week    Types: Marijuana     Teresa Almarie LABOR [place] 04/14/24 1951  "

## 2024-04-14 NOTE — Discharge Instructions (Addendum)
 Symptoms and physical exam findings are most consistent with gastroenteritis causing nausea, vomiting and diarrhea.  As you are able to stay hydrated, for now we recommend continuing to monitor symptoms and can try Zofran  for nausea.  Recommend over-the-counter Imodium A-D for diarrhea. This will likely improve in the next 24-4 hours. Make sure to stay hydrated by drinking plenty of water even if you do not feel like eating solid food.  Given the symptoms would advise not consuming large portions of alcoholic beverages until you are feeling better.  Can return to urgent care if needed.  Zofran  4 mg given at approximately 8 PM.  This medication is for nausea and vomiting.  You may take another dose if needed around 4 AM tomorrow

## 2024-04-14 NOTE — ED Notes (Signed)
Reviewed instructions

## 2024-04-14 NOTE — ED Triage Notes (Signed)
 Pt states she has has vomiting since Friday. She states diarrhea started Saturday. No vomiting today
